# Patient Record
Sex: Female | Born: 1980
Health system: Southern US, Community
[De-identification: ages and names within clinical notes are randomized; demographics above are authoritative.]

## PROBLEM LIST (undated history)

## (undated) DIAGNOSIS — L709 Acne, unspecified: Secondary | ICD-10-CM

## (undated) DIAGNOSIS — O24419 Gestational diabetes mellitus in pregnancy, unspecified control: Secondary | ICD-10-CM

## (undated) DIAGNOSIS — D649 Anemia, unspecified: Secondary | ICD-10-CM

## (undated) DIAGNOSIS — G971 Other reaction to spinal and lumbar puncture: Secondary | ICD-10-CM

## (undated) HISTORY — DX: Acne, unspecified: L70.9

## (undated) HISTORY — PX: TONSILLECTOMY: SUR1361

## (undated) HISTORY — DX: Anemia, unspecified: D64.9

---
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
## 2001-08-14 ENCOUNTER — Other Ambulatory Visit: Admission: RE | Admit: 2001-08-14 | Discharge: 2001-08-14 | Payer: Self-pay | Admitting: *Deleted

## 2002-05-14 ENCOUNTER — Encounter: Payer: Self-pay | Admitting: Emergency Medicine

## 2002-05-14 ENCOUNTER — Emergency Department (HOSPITAL_COMMUNITY): Admission: EM | Admit: 2002-05-14 | Discharge: 2002-05-14 | Payer: Self-pay | Admitting: Emergency Medicine

## 2002-08-31 ENCOUNTER — Other Ambulatory Visit: Admission: RE | Admit: 2002-08-31 | Discharge: 2002-08-31 | Payer: Self-pay | Admitting: *Deleted

## 2003-09-01 ENCOUNTER — Other Ambulatory Visit: Admission: RE | Admit: 2003-09-01 | Discharge: 2003-09-01 | Payer: Self-pay | Admitting: *Deleted

## 2004-09-25 ENCOUNTER — Other Ambulatory Visit: Admission: RE | Admit: 2004-09-25 | Discharge: 2004-09-25 | Payer: Self-pay | Admitting: *Deleted

## 2005-05-01 ENCOUNTER — Emergency Department (HOSPITAL_COMMUNITY): Admission: EM | Admit: 2005-05-01 | Discharge: 2005-05-02 | Payer: Self-pay | Admitting: Emergency Medicine

## 2005-06-14 ENCOUNTER — Ambulatory Visit: Payer: Self-pay | Admitting: Cardiology

## 2005-07-02 ENCOUNTER — Ambulatory Visit: Payer: Self-pay

## 2005-07-02 ENCOUNTER — Encounter: Payer: Self-pay | Admitting: Cardiology

## 2005-12-19 ENCOUNTER — Other Ambulatory Visit: Admission: RE | Admit: 2005-12-19 | Discharge: 2005-12-19 | Payer: Self-pay | Admitting: *Deleted

## 2009-09-28 ENCOUNTER — Ambulatory Visit: Payer: Self-pay | Admitting: Cardiology

## 2009-09-28 ENCOUNTER — Ambulatory Visit: Payer: Self-pay

## 2009-09-28 ENCOUNTER — Ambulatory Visit (HOSPITAL_COMMUNITY): Admission: RE | Admit: 2009-09-28 | Discharge: 2009-09-28 | Payer: Self-pay | Admitting: Family Medicine

## 2009-09-28 ENCOUNTER — Encounter: Payer: Self-pay | Admitting: Family Medicine

## 2009-10-07 ENCOUNTER — Telehealth: Payer: Self-pay | Admitting: Cardiology

## 2010-01-31 NOTE — Progress Notes (Signed)
Summary: test results  Phone Note Call from Patient Call back at Home Phone [phone] Call back at [phone]   Caller: Patient Reason for Call: Lab or Test Results Initial call taken by: Judie Grieve,  October 07, 2009 2:13 PM  Follow-up for Phone Call        I spoke with the pt. Dr. Christell Constant ordered her echo. Results have been faxed to his office. She is aware to contact Dr. Kathi Der office for results. She is agreeable. Follow-up by: Sherri Rad, RN, BSN,  October 07, 2009 2:26 PM

## 2012-03-20 ENCOUNTER — Other Ambulatory Visit: Payer: Self-pay | Admitting: Family Medicine

## 2012-03-20 ENCOUNTER — Ambulatory Visit (INDEPENDENT_AMBULATORY_CARE_PROVIDER_SITE_OTHER): Payer: 59

## 2012-03-20 ENCOUNTER — Ambulatory Visit (INDEPENDENT_AMBULATORY_CARE_PROVIDER_SITE_OTHER): Payer: 59 | Admitting: Family Medicine

## 2012-03-20 ENCOUNTER — Encounter: Payer: Self-pay | Admitting: Family Medicine

## 2012-03-20 VITALS — BP 91/55 | HR 76 | Temp 97.4°F | Ht 61.0 in | Wt 140.0 lb

## 2012-03-20 DIAGNOSIS — R718 Other abnormality of red blood cells: Secondary | ICD-10-CM

## 2012-03-20 DIAGNOSIS — R5381 Other malaise: Secondary | ICD-10-CM

## 2012-03-20 DIAGNOSIS — R079 Chest pain, unspecified: Secondary | ICD-10-CM

## 2012-03-20 DIAGNOSIS — N920 Excessive and frequent menstruation with regular cycle: Secondary | ICD-10-CM | POA: Insufficient documentation

## 2012-03-20 DIAGNOSIS — R0602 Shortness of breath: Secondary | ICD-10-CM

## 2012-03-20 DIAGNOSIS — D649 Anemia, unspecified: Secondary | ICD-10-CM | POA: Insufficient documentation

## 2012-03-20 DIAGNOSIS — R0789 Other chest pain: Secondary | ICD-10-CM

## 2012-03-20 LAB — POCT CBC
Granulocyte percent: 64.8 %G (ref 37–80)
MCH, POC: 26.8 pg — AB (ref 27–31.2)
MPV: 9.4 fL (ref 0–99.8)
Platelet Count, POC: 265 10*3/uL (ref 142–424)
RBC: 4.6 M/uL (ref 4.04–5.48)
WBC: 6.6 10*3/uL (ref 4.6–10.2)

## 2012-03-20 NOTE — Progress Notes (Signed)
Subjective:    Patient ID: Katherine Wilkerson, female    DOB: [birth year], 32 y.o.   MRN: [id number]  HPI 31 YR OLD cf PRESENTS WITH CHEST PRESSure for the past month or so.  Seems to have worsened.  Pressure comes and goes-tends to worsen toward the end of the day and persists into the evening.  Also, she has some DOE out of proportion to the activity that she feels like she is doing. Alleviating factors include stopping activity and during sleep. (5/10 pain). No recent dental visits.  She is +/- compliance on her iron supplementation.  Echo 09/2009-normal EF. No mitral regurg.  Mild tricuspid regurg. (Dr. Antoine Poche performed and I reviewed in chart)  Last gyn visit January 2014 Dr. Renaldo Fiddler at [facility].  She gave her some OCPs(patient doesn't remember name of pills) she never started this birth control pill area   Review of Systems  Constitutional: Negative for chills.  Respiratory: Positive for chest tightness, shortness of breath and stridor. Negative for cough and wheezing.   Cardiovascular: Positive for chest pain (pressure) and palpitations.  Gastrointestinal: Negative for nausea, vomiting, abdominal pain, diarrhea, constipation, blood in stool and abdominal distention.  Genitourinary: Positive for menstrual problem (heavy periods). Negative for dysuria, frequency and flank pain.  Neurological: Positive for dizziness and headaches. Negative for numbness.       Objective:   Physical Exam BP 91/55  Pulse 76  Temp(Src) 97.4 F (36.3 C) (Oral)  Ht 5\' 1"  (1.549 m)  Wt 140 lb (63.504 kg)  BMI 26.47 kg/m2  The patient appeared well nourished and normally developed, alert and oriented to time and place. Speech, behavior and judgement appear normal. Vital signs as documented.  Head exam is unremarkable. No scleral icterus or pallor noted.  Neck is without jugular venous distension, thyromegally, or carotid bruits. Carotid upstrokes are brisk bilaterally. No cervical  adenopathy. Lungs are clear anteriorly and posteriorly to auscultation. Normal respiratory effort. Cardiac exam reveals regular rate and rhythm. First and second heart sounds normal. No audible murmurs, rubs or gallops.  Abdominal exam reveals normal bowl sounds, no masses, no organomegaly and no aortic enlargement. No inguinal adenopathy. Extremities are nonedematous and both femoral and pedal pulses are normal. Skin without pallor or jaundice.  Warm and dry, without rash. Neurologic exam reveals normal deep tendon reflexes and normal sensation.     Results for orders placed in visit on 03/20/12  POCT CBC      Result Value Range   WBC 6.6  4.6 - 10.2 K/uL   Lymph, poc 1.9  0.6 - 3.4   POC LYMPH PERCENT 29.4  10 - 50 %L   MID (cbc)    0 - 0.9   POC MID %    0 - 12 %M   POC Granulocyte 4.3  2 - 6.9   Granulocyte percent 64.8  37 - 80 %G   RBC 4.6  4.04 - 5.48 M/uL   Hemoglobin 12.3  12.2 - 16.2 g/dL   HCT, POC 36 (*) 40.9 - 47.9 %   MCV 78.5 (*) 80 - 97 fL   MCH, POC 26.8 (*) 27 - 31.2 pg   MCHC 34.2  31.8 - 35.4 g/dL   RDW, POC 81.1     Platelet Count, POC 265  142 - 424 K/uL   MPV 9.4  0 - 99.8 fL   [facility] reading (PRIMARY) by  Dr. Christell Constant appeared normal  EKG: :"normal EKG, normal sinus rhythm","unchanged from previous tracings".       Assessment & Plan:  1. SOB (shortness of breath)  - POCT CBC - EKG 12-Lead - TSH  2. RBC microcytosis  - Anemia panel 7  3. Other malaise and fatigue  - TSH - Anemia panel 7 Review of the labs when available Pt to call with the name of the birth control pill which she received from a gynecologist She is not going to start that medicine yet She is supposed to have increase compliance with her current medication taking one daily iron   Spent 40 minutes with patient and her mother and answered all of her questions pertaining to labs being drawn x-rays and EKGs and followup

## 2012-03-21 LAB — ANEMIA PANEL 7
%SAT: 6 % — ABNORMAL LOW (ref 20–55)
ABS Retic: 37.4 10*3/uL (ref 19.0–186.0)
Ferritin: 3 ng/mL — ABNORMAL LOW (ref 10–291)
HCT: 36.1 % (ref 36.0–46.0)
MCH: 25.7 pg — ABNORMAL LOW (ref 26.0–34.0)
MCV: 77.3 fL — ABNORMAL LOW (ref 78.0–100.0)
RDW: 14.4 % (ref 11.5–15.5)
TIBC: 433 ug/dL (ref 250–470)
Vitamin B-12: 446 pg/mL (ref 211–911)
WBC: 6.1 10*3/uL (ref 4.0–10.5)

## 2012-03-27 MED ORDER — TANDEM PLUS 162-115.2-1 MG PO CAPS: 1.0000 | ORAL_CAPSULE | Freq: Every day | ORAL | Status: DC

## 2012-03-27 NOTE — Addendum Note (Signed)
Addended by: Bearl Mulberry on: 03/27/2012 06:29 PM   Modules accepted: Orders

## 2012-04-10 ENCOUNTER — Ambulatory Visit: Payer: 59 | Admitting: Family Medicine

## 2012-04-16 ENCOUNTER — Encounter: Payer: Self-pay | Admitting: Family Medicine

## 2012-04-16 ENCOUNTER — Ambulatory Visit (INDEPENDENT_AMBULATORY_CARE_PROVIDER_SITE_OTHER): Payer: 59 | Admitting: Family Medicine

## 2012-04-16 VITALS — BP 96/58 | HR 76 | Temp 97.1°F | Wt 139.4 lb

## 2012-04-16 DIAGNOSIS — M79609 Pain in unspecified limb: Secondary | ICD-10-CM

## 2012-04-16 DIAGNOSIS — M79604 Pain in right leg: Secondary | ICD-10-CM

## 2012-04-16 NOTE — Patient Instructions (Signed)
Continue birth control by mouth. Take regularly. Start iron 1 daily Get Doppler study

## 2012-04-16 NOTE — Progress Notes (Signed)
  Subjective:    Patient ID: Katherine Wilkerson, female    DOB: 05-19-1980, 32 y.o.   MRN: [id number]  HPI Katherine Wilkerson complains of increased leg pain and slight swelling for the past 3 weeks. She had some of this leg pain prior to starting the BCP.  Review of Systems  Musculoskeletal: Positive for arthralgias (bilateral calf pain with swelling L foot).   no fever coughing shortness of breath or chest pain.     Objective:   Physical Exam  Nursing note and vitals reviewed. Constitutional: She is oriented to person, place, and time. She appears well-developed and well-nourished. No distress.  HENT:  Head: Normocephalic.  Eyes: Conjunctivae are normal.  Neck: Normal range of motion.  Cardiovascular: Normal rate, regular rhythm and normal heart sounds.  Exam reveals no gallop and no friction rub.   No murmur heard. Good pedal pulses bilaterally  Pulmonary/Chest: Effort normal and breath sounds normal. No respiratory distress. She has no wheezes. She has no rales. She exhibits no tenderness.  Abdominal: Soft.  Musculoskeletal: Normal range of motion. She exhibits edema (slight edema in the dorsal left foot. Slight tenderness dorsal left foot) and tenderness.  Homans sign negative bilaterally  Neurological: She is alert and oriented to person, place, and time.  Skin: Skin is warm and dry.  Psychiatric: She has a normal mood and affect. Her behavior is normal. Judgment and thought content normal.          Assessment & Plan:  Bilateral leg and foot pain  Patient Instructions  Continue birth control by mouth. Take regularly. Start iron 1 daily Get Doppler study

## 2012-04-17 ENCOUNTER — Ambulatory Visit
Admission: RE | Admit: 2012-04-17 | Discharge: 2012-04-17 | Disposition: A | Discharge status: Home / Self Care | Payer: 59 | Source: Ambulatory Visit | Attending: Family Medicine | Admitting: Family Medicine

## 2012-04-17 DIAGNOSIS — M79604 Pain in right leg: Secondary | ICD-10-CM

## 2012-04-17 DIAGNOSIS — M79605 Pain in left leg: Secondary | ICD-10-CM

## 2012-05-12 ENCOUNTER — Encounter: Payer: Self-pay | Admitting: Family Medicine

## 2012-05-12 ENCOUNTER — Other Ambulatory Visit: Payer: Self-pay | Admitting: Family Medicine

## 2012-05-12 ENCOUNTER — Ambulatory Visit (INDEPENDENT_AMBULATORY_CARE_PROVIDER_SITE_OTHER): Payer: 59 | Admitting: Family Medicine

## 2012-05-12 VITALS — BP 102/64 | HR 64 | Temp 97.2°F | Ht 61.0 in | Wt 140.2 lb

## 2012-05-12 DIAGNOSIS — N920 Excessive and frequent menstruation with regular cycle: Secondary | ICD-10-CM

## 2012-05-12 DIAGNOSIS — D649 Anemia, unspecified: Secondary | ICD-10-CM

## 2012-05-12 DIAGNOSIS — R5381 Other malaise: Secondary | ICD-10-CM

## 2012-05-12 DIAGNOSIS — R5383 Other fatigue: Secondary | ICD-10-CM

## 2012-05-12 LAB — IRON AND TIBC
%SAT: 6 % — ABNORMAL LOW (ref 20–55)
Iron: 28 ug/dL — ABNORMAL LOW (ref 42–145)
UIBC: 453 ug/dL — ABNORMAL HIGH (ref 125–400)

## 2012-05-12 LAB — POCT CBC
Granulocyte percent: 63.3 %G (ref 37–80)
HCT, POC: 35.5 % — AB (ref 37.7–47.9)
Hemoglobin: 11.7 g/dL — AB (ref 12.2–16.2)
MCHC: 33 g/dL (ref 31.8–35.4)
POC Granulocyte: 4.3 (ref 2–6.9)

## 2012-05-12 NOTE — Progress Notes (Signed)
  Subjective:    Patient ID: Katherine Wilkerson, female    DOB: [birth year], 32 y.o.   MRN: [id number]  HPI Since starting the birth control pill for heavy flow and anemia she has felt some better. She is also still taking the iron by mouth but we called in for her. This is tandem plus.   Review of Systems  Constitutional: Positive for fatigue (improving).  Respiratory: Positive for shortness of breath (improving).   Musculoskeletal: Positive for myalgias (leg pain improving).       Objective:   Physical Exam  Nursing note and vitals reviewed. Constitutional: She is oriented to person, place, and time. She appears well-developed and well-nourished.  HENT:  Head: Normocephalic and atraumatic.  Eyes: Conjunctivae are normal.  Neck: Normal range of motion.  Cardiovascular: Normal rate, regular rhythm and normal heart sounds.   No murmur heard. Pulmonary/Chest: Effort normal and breath sounds normal. No respiratory distress. She has no wheezes.  Abdominal: Soft. Bowel sounds are normal. She exhibits no mass. There is no tenderness. There is no guarding.  Musculoskeletal: Normal range of motion. She exhibits no edema and no tenderness.  Neurological: She is alert and oriented to person, place, and time.  Skin: Skin is warm and dry. Rash noted.  Slight facial rash each cheek near the mouth. These are raised papular areas.  Psychiatric: Her behavior is normal. Judgment and thought content normal.  Some tearfulness and anxiety with a lot of things that are happening in her life personally.          Assessment & Plan:   1. Anemia - POCT CBC - Iron and TIBC  2. Fatigue - POCT CBC - Iron and TIBC  3. Menorrhagia   Patient Instructions  Continue current medications as doing until lab work is back. Hopefully we can discontinue the iron pil,l but not until we get the lab work is back here Remember,caffeine can increase anxiety levels. Continue current birth control pill at  least for a couple more cycles. Then maybe talk with GYN and see if there is a cheaper alternative that will help both the      complexion and iron

## 2012-05-12 NOTE — Patient Instructions (Addendum)
Continue current medications as doing until lab work is back. Hopefully we can discontinue the iron pil,l but not until we get the lab work is back here Remember,caffeine can increase anxiety levels. Continue current birth control pill at least for a couple more cycles. Then maybe talk with GYN and see if there is a cheaper alternative that will help both the      complexion and iron

## 2012-05-13 LAB — FERRITIN: Ferritin: 3 ng/mL — ABNORMAL LOW (ref 10–291)

## 2012-05-14 ENCOUNTER — Telehealth: Payer: Self-pay | Admitting: *Deleted

## 2012-05-14 DIAGNOSIS — D649 Anemia, unspecified: Secondary | ICD-10-CM

## 2012-05-14 NOTE — Telephone Encounter (Signed)
Pt notified of results Order put in for CBC

## 2012-05-14 NOTE — Telephone Encounter (Signed)
Message copied by Bearl Mulberry on Wed May 14, 2012  5:01 PM ------      Message from: Ernestina Penna      Created: Mon May 12, 2012  5:28 PM       Hemoglobin 11.7 slightly decreased from a month, WBC within normal limits, platelet count normal      Continue current iron medication and birth control as doing, recheck CBC with differential in 6 weeks      Ferritin level will be back within the next 2 days, will call when this is available ------

## 2012-05-22 ENCOUNTER — Telehealth: Payer: Self-pay | Admitting: *Deleted

## 2012-05-22 NOTE — Telephone Encounter (Signed)
Left message for pt to call Per Tammy pt mat want to try liquid iron sulfate elixir for better absorption 10ml mixed with orange juice

## 2012-05-28 ENCOUNTER — Telehealth: Payer: Self-pay | Admitting: Family Medicine

## 2012-05-29 ENCOUNTER — Telehealth: Payer: Self-pay | Admitting: *Deleted

## 2012-05-29 NOTE — Telephone Encounter (Signed)
Discussed with pt the option of taking the liquid iron supplement instead of the tablet form due to better absorption Pt wants to wait until labs are rechecked before changing

## 2012-06-27 ENCOUNTER — Other Ambulatory Visit (INDEPENDENT_AMBULATORY_CARE_PROVIDER_SITE_OTHER): Payer: 59

## 2012-06-27 DIAGNOSIS — D649 Anemia, unspecified: Secondary | ICD-10-CM

## 2012-06-27 LAB — POCT CBC
Granulocyte percent: 66.1 %G (ref 37–80)
Hemoglobin: 12.1 g/dL — AB (ref 12.2–16.2)
MCHC: 34.9 g/dL (ref 31.8–35.4)
MPV: 9 fL (ref 0–99.8)
POC Granulocyte: 4.5 (ref 2–6.9)
RBC: 4.3 M/uL (ref 4.04–5.48)

## 2012-06-27 NOTE — Progress Notes (Signed)
Pt came in for labs only 

## 2012-07-03 ENCOUNTER — Telehealth: Payer: Self-pay | Admitting: Family Medicine

## 2012-07-03 NOTE — Telephone Encounter (Signed)
Pt notified of results Will return in 2 weeks for repeat CBC

## 2012-07-03 NOTE — Telephone Encounter (Signed)
Message copied by Bearl Mulberry on Thu Jul 03, 2012  5:36 PM ------      Message from: Ernestina Penna      Created: Fri Jun 27, 2012  6:11 PM       WBC is within normal, the hemoglobin is slightly improved from what it was one month ago. He is now 12.1 the normal is 12.2-16.2. Ask if she is still taking the iron that we called in for her. If so continue to take this and recheck CBC in 2 months ------

## 2012-07-11 ENCOUNTER — Other Ambulatory Visit: Payer: Self-pay | Admitting: Family Medicine

## 2012-07-11 NOTE — Telephone Encounter (Signed)
Last seen 05/12/12  [facility]    Last CBC 06/27/12  Hemoglobin 12.1   Do you want to Refill for patient?

## 2012-08-27 ENCOUNTER — Ambulatory Visit: Payer: 59 | Admitting: Family Medicine

## 2012-08-27 ENCOUNTER — Telehealth: Payer: Self-pay | Admitting: Family Medicine

## 2012-08-27 NOTE — Telephone Encounter (Signed)
appt rescheduled.

## 2012-09-10 ENCOUNTER — Other Ambulatory Visit: Payer: Self-pay | Admitting: Family Medicine

## 2012-09-29 ENCOUNTER — Ambulatory Visit (INDEPENDENT_AMBULATORY_CARE_PROVIDER_SITE_OTHER): Payer: 59 | Admitting: Family Medicine

## 2012-09-29 ENCOUNTER — Encounter: Payer: Self-pay | Admitting: Family Medicine

## 2012-09-29 VITALS — BP 102/61 | HR 63 | Temp 98.3°F | Ht 61.0 in | Wt 143.0 lb

## 2012-09-29 DIAGNOSIS — D649 Anemia, unspecified: Secondary | ICD-10-CM

## 2012-09-29 DIAGNOSIS — N920 Excessive and frequent menstruation with regular cycle: Secondary | ICD-10-CM

## 2012-09-29 NOTE — Progress Notes (Signed)
Subjective:    Patient ID: Katherine Wilkerson, female    DOB: 1980-03-13, 32 y.o.   MRN: [id number]  HPI Pt here for follow up and management of chronic medical problems. Patient has been taking her iron medication and a birth control by mouth which has iron in it. Patient says she is feeling much better and that she has been taking the iron pill about 5 days weekly but does take a birth control pill regularly. She does have a visit our plans a visit to have her pelvic exam done in December. She indicates that the birth control pill because of his brand-name is very expensive and she will call the gynecologist to try to get a generic version of this pill.   Patient Active Problem List   Diagnosis Date Noted  . Menorrhagia 03/20/2012  . Anemia 03/20/2012   Outpatient Encounter Prescriptions as of 09/29/2012  Medication Sig Dispense Refill  . clindamycin-tretinoin (ZIANA) gel Apply topically at bedtime.      . FeFum-FePo-FA-B Cmp-C-Zn-Mn-Cu (TANDEM PLUS) 162-115.2-1 MG CAPS TAKE (1) CAPSULE DAILY  30 each  3  . Norethin-Eth Estradiol-Fe (GENERESS FE PO) Take 1 tablet by mouth daily.      . [DISCONTINUED] cholecalciferol (VITAMIN D) 1000 UNITS tablet Take 4,000 Units by mouth daily.       . [DISCONTINUED] clindamycin-tretinoin (ZIANA) gel Apply topically at bedtime.       No facility-administered encounter medications on file as of 09/29/2012.       Review of Systems  Constitutional: Negative.   HENT: Negative.   Eyes: Negative.   Respiratory: Negative.   Cardiovascular: Negative.   Gastrointestinal: Negative.   Endocrine: Negative.   Genitourinary: Negative.        Heavy periods- better now with bcp  Musculoskeletal: Negative.   Skin: Negative.   Allergic/Immunologic: Negative.   Neurological: Negative.   Hematological: Negative.  Does not bruise/bleed easily.  Psychiatric/Behavioral: Negative.        Objective:   Physical Exam  Nursing note and vitals  reviewed. Constitutional: She is oriented to person, place, and time. She appears well-developed and well-nourished. No distress.  HENT:  Head: Normocephalic and atraumatic.  Right Ear: External ear normal.  Left Ear: External ear normal.  Mouth/Throat: Oropharynx is clear and moist. No oropharyngeal exudate.  Eyes: Conjunctivae are normal. Right eye exhibits no discharge. Left eye exhibits no discharge. No scleral icterus.  Neck: Normal range of motion. Neck supple. No thyromegaly present.  Cardiovascular: Normal rate, regular rhythm and normal heart sounds.   No murmur heard. Pulmonary/Chest: Effort normal and breath sounds normal. She has no wheezes. She has no rales.  Abdominal: Soft. Bowel sounds are normal. There is no rebound and no guarding.  Musculoskeletal: Normal range of motion. She exhibits no edema.  Lymphadenopathy:    She has no cervical adenopathy.  Neurological: She is alert and oriented to person, place, and time. She has normal reflexes.  Skin: Skin is warm and dry. No rash noted.  Psychiatric: She has a normal mood and affect. Her behavior is normal. Judgment and thought content normal.   BP 102/61  Pulse 63  Temp(Src) 98.3 F (36.8 C) (Oral)  Ht 5\' 1"  (1.549 m)  Wt 143 lb (64.864 kg)  BMI 27.03 kg/m2  LMP 09/12/2012         Assessment & Plan:   1. Menorrhagia   2. Anemia     Orders Placed This Encounter  Procedures  . Anemia Profile B  Meds ordered this encounter  Medications  . clindamycin-tretinoin (ZIANA) gel    Sig: Apply topically at bedtime.   Patient Instructions  Continue current medications. Continue good therapeutic lifestyle changes.  Fall precautions discussed with patient. Schedule your flu vaccine the first of October. Follow up as planned and earlier as needed.  Continue to take tandem Monday through Friday and continue birth control pill as doing other than trying to get the generic version.  CBC in about 6 weeks Annual  physical exam in December    Nyra Capes MD

## 2012-09-29 NOTE — Patient Instructions (Addendum)
Continue current medications. Continue good therapeutic lifestyle changes.  Fall precautions discussed with patient. Schedule your flu vaccine the first of October. Follow up as planned and earlier as needed.  Continue to take tandem Monday through Friday and continue birth control pill as doing other than trying to get the generic version.  CBC in about 6 weeks Annual physical exam in December

## 2012-09-30 LAB — ANEMIA PROFILE B
Basophils Absolute: 0 10*3/uL (ref 0.0–0.2)
HCT: 37.9 % (ref 34.0–46.6)
Iron Saturation: 11 % — ABNORMAL LOW (ref 15–55)
Iron: 44 ug/dL (ref 35–155)
Lymphocytes Absolute: 1.8 10*3/uL (ref 0.7–3.1)
MCH: 28.3 pg (ref 26.6–33.0)
MCV: 85 fL (ref 79–97)
Monocytes: 9 %
Neutrophils Absolute: 3.9 10*3/uL (ref 1.4–7.0)
RDW: 12.9 % (ref 12.3–15.4)
TIBC: 399 ug/dL (ref 250–450)
UIBC: 355 ug/dL (ref 150–375)
WBC: 6.3 10*3/uL (ref 3.4–10.8)

## 2012-11-14 ENCOUNTER — Encounter (INDEPENDENT_AMBULATORY_CARE_PROVIDER_SITE_OTHER): Payer: Self-pay

## 2012-11-14 ENCOUNTER — Other Ambulatory Visit (INDEPENDENT_AMBULATORY_CARE_PROVIDER_SITE_OTHER): Payer: 59

## 2012-11-14 DIAGNOSIS — N920 Excessive and frequent menstruation with regular cycle: Secondary | ICD-10-CM

## 2012-11-14 DIAGNOSIS — D649 Anemia, unspecified: Secondary | ICD-10-CM

## 2012-11-14 LAB — POCT CBC
Granulocyte percent: 64.8 %G (ref 37–80)
HCT, POC: 38 % (ref 37.7–47.9)
Lymph, poc: 2 (ref 0.6–3.4)
POC Granulocyte: 4.3 (ref 2–6.9)
Platelet Count, POC: 216 10*3/uL (ref 142–424)
RDW, POC: 12.6 %
WBC: 6.6 10*3/uL (ref 4.6–10.2)

## 2012-11-18 ENCOUNTER — Encounter: Payer: Self-pay | Admitting: *Deleted

## 2012-11-18 NOTE — Progress Notes (Signed)
Quick Note:  Copy of labs sent to patient ______ 

## 2012-12-15 ENCOUNTER — Telehealth: Payer: Self-pay | Admitting: *Deleted

## 2012-12-15 MED ORDER — AZITHROMYCIN 250 MG PO TABS: ORAL_TABLET | ORAL | Status: DC

## 2012-12-15 NOTE — Telephone Encounter (Signed)
Please call prescription in for a Z-Pak, take as directed

## 2012-12-15 NOTE — Telephone Encounter (Signed)
Pt is experiencing sinus pressure, congestion, and cough (worse at night)  She has a CPE appt with [facility] tommorow, but thinks this cant wait another day. She would like med called into [facility] pharmacy please.

## 2012-12-16 ENCOUNTER — Encounter: Payer: Self-pay | Admitting: Family Medicine

## 2012-12-16 ENCOUNTER — Ambulatory Visit (INDEPENDENT_AMBULATORY_CARE_PROVIDER_SITE_OTHER): Payer: 59 | Admitting: Family Medicine

## 2012-12-16 VITALS — BP 111/68 | HR 74 | Temp 98.1°F | Ht 61.0 in | Wt 138.0 lb

## 2012-12-16 DIAGNOSIS — Z8719 Personal history of other diseases of the digestive system: Secondary | ICD-10-CM | POA: Insufficient documentation

## 2012-12-16 DIAGNOSIS — J329 Chronic sinusitis, unspecified: Secondary | ICD-10-CM

## 2012-12-16 DIAGNOSIS — D649 Anemia, unspecified: Secondary | ICD-10-CM

## 2012-12-16 DIAGNOSIS — Z0000 Encounter for general adult medical examination without abnormal findings: Secondary | ICD-10-CM

## 2012-12-16 NOTE — Patient Instructions (Addendum)
Continue current medications. Continue good therapeutic lifestyle changes which include good diet and exercise. Schedule your flu vaccine if you haven't had it yet. A cool mist humidifier in her bedroom at night would be helpful for her sinus congestion Saline nose spray, also be helpful if this is used frequently during the day and you can buy this over-the-counter. If problems continue with congestionyou can buy Nasacort over-the-counter. For the chest wall discomfort he may try using some warm wet compresses and leg are some of the weight lifting for a few days. If the discomfort is severe You can try a few Advil or Aleve always taking it after eating. finish the antibiotic that was started yesterday and if you do not continue to improve let us know about this

## 2012-12-16 NOTE — Progress Notes (Signed)
Subjective:    Patient ID: Katherine Wilkerson, female    DOB: 1980/02/08, 32 y.o.   MRN: [id number]  HPI Patient here today for annual physical.patient called yesterday complaining of increased sinus congestion and drainage with some cough requesting an antibiotic and a Z-Pak was called in for this. Shehas stopped taking her iron medication continues to take her  birth control pillswhich have recently been changed by her gynecologist due to the expense of the previous pill.she also complains of some chest wall soreness and attributes this to her exercise workout with a therapist. The sinus problem had been going on for about a week and she had green drainage but no fever. As indicated above this treatment was started yesterday. Her purulence have been heavier with a new birth control pill and she is scheduled to get a pelvic exam today by Dr. Zelphia Cairo.she has had an eye exam this year and indicates that she wears a contact in one. She indicates that her energy level is pretty good.     Patient Active Problem List   Diagnosis Date Noted  . Menorrhagia 03/20/2012  . Anemia 03/20/2012   Outpatient Encounter Prescriptions as of 12/16/2012  Medication Sig  . azithromycin (ZITHROMAX) 250 MG tablet As directed  . clindamycin-tretinoin (ZIANA) gel Apply topically at bedtime.  . FeFum-FePo-FA-B Cmp-C-Zn-Mn-Cu (TANDEM PLUS) 162-115.2-1 MG CAPS TAKE (1) CAPSULE DAILY  . JUNEL FE 1/20 1-20 MG-MCG tablet Take 1 tablet by mouth daily.  . [DISCONTINUED] Norethin-Eth Estradiol-Fe (GENERESS FE PO) Take 1 tablet by mouth daily.    Review of Systems  Constitutional: Negative.  Negative for fever.  HENT: Positive for congestion, postnasal drip, sinus pressure and sore throat.   Eyes: Negative.   Respiratory: Positive for cough.   Cardiovascular: Negative.   Gastrointestinal: Negative.   Endocrine: Negative.   Genitourinary: Negative.   Musculoskeletal: Negative.   Skin: Negative.     Allergic/Immunologic: Negative.   Neurological: Negative.   Hematological: Negative.   Psychiatric/Behavioral: Negative.        Objective:   Physical Exam  Nursing note and vitals reviewed. Constitutional: She is oriented to person, place, and time. She appears well-developed and well-nourished. No distress.  HENT:  Head: Normocephalic and atraumatic.  Right Ear: External ear normal.  Left Ear: External ear normal.  Mouth/Throat: Oropharynx is clear and moist.  There is nasal congestion bilaterally  Eyes: Conjunctivae and EOM are normal. Pupils are equal, round, and reactive to light. Right eye exhibits no discharge. Left eye exhibits no discharge. No scleral icterus.  Neck: Normal range of motion. Neck supple. No thyromegaly present.  Cardiovascular: Normal rate, regular rhythm, normal heart sounds and intact distal pulses.  Exam reveals no gallop and no friction rub.   No murmur heard. At 72 per minute  Pulmonary/Chest: Effort normal and breath sounds normal. No respiratory distress. She has no wheezes. She has no rales. She exhibits no tenderness.  No axillary nodes  Abdominal: Soft. Bowel sounds are normal. She exhibits no mass. There is tenderness (there is some generalized tendernessbut no specific location). There is no rebound and no guarding.  There are no inguinal nodes  Musculoskeletal: Normal range of motion. She exhibits no edema and no tenderness.  Lymphadenopathy:    She has no cervical adenopathy.  Neurological: She is alert and oriented to person, place, and time. She has normal reflexes. No cranial nerve deficit.  Skin: Skin is warm and dry.  Psychiatric: She has a normal mood and  affect. Her behavior is normal. Judgment and thought content normal.   BP 111/68  Pulse 74  Temp(Src) 98.1 F (36.7 C) (Oral)  Ht 5\' 1"  (1.549 m)  Wt 138 lb (62.596 kg)  BMI 26.09 kg/m2  LMP 12/05/2012        Assessment & Plan:  1. Anemia - Anemia Profile B  2. Annual  physical exam - NMR, lipoprofile - BMP8+EGFR - Hepatic function panel - Thyroid Panel With TSH - Vit D  25 hydroxy (rtn osteoporosis monitoring) - Anemia Profile B  3. Sinusitis  4. History of irritable bowel syndrome  Patient Instructions  Continue current medications. Continue good therapeutic lifestyle changes which include good diet and exercise. Schedule your flu vaccine if you haven't had it yet. A cool mist humidifier in her bedroom at night would be helpful for her sinus congestion Saline nose spray, also be helpful if this is used frequently during the day and you can buy this over-the-counter. If problems continue with congestionyou can buy Nasacort over-the-counter. For the chest wall discomfort he may try using some warm wet compresses and leg are some of the weight lifting for a few days. If the discomfort is severe You can try a few Advil or Aleve always taking it after eating. finish the antibiotic that was started yesterday and if you do not continue to improve let us know about this      Nyra Capes MD

## 2012-12-18 LAB — VITAMIN D 25 HYDROXY (VIT D DEFICIENCY, FRACTURES): Vit D, 25-Hydroxy: 20.8 ng/mL — ABNORMAL LOW (ref 30.0–100.0)

## 2012-12-18 LAB — ANEMIA PROFILE B
Basophils Absolute: 0 10*3/uL (ref 0.0–0.2)
Eos: 1 %
Eosinophils Absolute: 0.1 10*3/uL (ref 0.0–0.4)
Folate: 7.6 ng/mL (ref >3.0)
HCT: 37.2 % (ref 34.0–46.6)
Hemoglobin: 12.3 g/dL (ref 11.1–15.9)
Immature Grans (Abs): 0 10*3/uL (ref 0.0–0.1)
Iron Saturation: 12 % — ABNORMAL LOW (ref 15–55)
Iron: 42 ug/dL (ref 35–155)
Lymphocytes Absolute: 1.5 10*3/uL (ref 0.7–3.1)
MCHC: 33.1 g/dL (ref 31.5–35.7)
MCV: 86 fL (ref 79–97)
Neutrophils Absolute: 7.2 10*3/uL — ABNORMAL HIGH (ref 1.4–7.0)
RBC: 4.32 x10E6/uL (ref 3.77–5.28)
RDW: 12.8 % (ref 12.3–15.4)
TIBC: 356 ug/dL (ref 250–450)
UIBC: 314 ug/dL (ref 150–375)

## 2012-12-18 LAB — NMR, LIPOPROFILE
Cholesterol: 173 mg/dL (ref <200)
HDL Cholesterol by NMR: 42 mg/dL (ref >=40)
HDL Particle Number: 36.8 umol/L (ref >=30.5)
LDLC SERPL CALC-MCNC: 101 mg/dL — ABNORMAL HIGH (ref <100)
LP-IR Score: 56 — ABNORMAL HIGH (ref <=45)
Small LDL Particle Number: 570 nmol/L — ABNORMAL HIGH (ref <=527)
Triglycerides by NMR: 150 mg/dL — ABNORMAL HIGH (ref <150)

## 2012-12-18 LAB — HEPATIC FUNCTION PANEL
Alkaline Phosphatase: 50 IU/L (ref 39–117)
Bilirubin, Direct: 0.1 mg/dL (ref 0.00–0.40)
Total Bilirubin: 0.4 mg/dL (ref 0.0–1.2)
Total Protein: 6.5 g/dL (ref 6.0–8.5)

## 2012-12-18 LAB — BMP8+EGFR
BUN/Creatinine Ratio: 13 (ref 8–20)
Calcium: 9.3 mg/dL (ref 8.7–10.2)
GFR calc Af Amer: 135 mL/min/{1.73_m2} (ref >59)
GFR calc non Af Amer: 117 mL/min/{1.73_m2} (ref >59)
Glucose: 81 mg/dL (ref 65–99)
Potassium: 4.7 mmol/L (ref 3.5–5.2)

## 2012-12-18 LAB — THYROID PANEL WITH TSH: TSH: 1.47 u[IU]/mL (ref 0.450–4.500)

## 2012-12-29 ENCOUNTER — Telehealth: Payer: Self-pay | Admitting: Family Medicine

## 2012-12-29 NOTE — Telephone Encounter (Signed)
Pt aware of labs  

## 2013-01-07 ENCOUNTER — Ambulatory Visit: Payer: 59 | Admitting: Family Medicine

## 2013-06-10 ENCOUNTER — Telehealth: Payer: Self-pay | Admitting: Family Medicine

## 2013-06-10 NOTE — Telephone Encounter (Signed)
appt moved to tomorrow

## 2013-06-11 ENCOUNTER — Ambulatory Visit (INDEPENDENT_AMBULATORY_CARE_PROVIDER_SITE_OTHER): Payer: 59

## 2013-06-11 ENCOUNTER — Ambulatory Visit (INDEPENDENT_AMBULATORY_CARE_PROVIDER_SITE_OTHER): Payer: 59 | Admitting: Family Medicine

## 2013-06-11 ENCOUNTER — Encounter: Payer: Self-pay | Admitting: Family Medicine

## 2013-06-11 VITALS — BP 111/70 | HR 67 | Temp 97.6°F | Ht 61.0 in | Wt 125.0 lb

## 2013-06-11 DIAGNOSIS — R079 Chest pain, unspecified: Secondary | ICD-10-CM

## 2013-06-11 DIAGNOSIS — M940 Chondrocostal junction syndrome [Tietze]: Secondary | ICD-10-CM

## 2013-06-11 DIAGNOSIS — R0789 Other chest pain: Secondary | ICD-10-CM

## 2013-06-11 LAB — POCT CBC
Granulocyte percent: 60.4 %G (ref 37–80)
HCT, POC: 39 % (ref 37.7–47.9)
Hemoglobin: 12.5 g/dL (ref 12.2–16.2)
LYMPH, POC: 1.9 (ref 0.6–3.4)
MCH: 27.4 pg (ref 27–31.2)
MCHC: 31.9 g/dL (ref 31.8–35.4)
MCV: 85.9 fL (ref 80–97)
MPV: 9.2 fL (ref 0–99.8)
PLATELET COUNT, POC: 244 10*3/uL (ref 142–424)
POC Granulocyte: 3.8 (ref 2–6.9)
POC LYMPH %: 30.5 % (ref 10–50)
RBC: 4.5 M/uL (ref 4.04–5.48)
RDW, POC: 12.7 %
WBC: 6.3 10*3/uL (ref 4.6–10.2)

## 2013-06-11 NOTE — Patient Instructions (Signed)
Avoid exercises especially pushups chest wall stretching heavy lifting for the next 7-10 day Use warm wet compresses 20 minutes 3 or 4 times daily to the chest wall Take ibuprofen 200 mg one after each meal for the next 7-10 days To protect your stomach from the ibuprofen take Zantac 75 twice daily before breakfast and supper

## 2013-06-11 NOTE — Progress Notes (Signed)
Subjective:    Patient ID: Katherine Wilkerson, female    DOB: Jul 16, 1980, 33 y.o.   MRN: [id number]  HPI Patient comes in today with left sided chest pain/presure,shortness of breath and fatigue. She noticed the onset after pulling a muscle in her chest but the problem has been more persistent over the past week. The patient has been exercising a lot and may have irritated her chest wall with her exercising. Her echocardiogram that was done in 2011 was reviewed with her today and she had a normal ejection fraction and mild if any mitral valve prolapse    Review of Systems  Constitutional: Positive for fatigue.  HENT: Negative.   Eyes: Negative.   Respiratory: Positive for chest tightness and shortness of breath.   Cardiovascular: Positive for chest pain.  Gastrointestinal: Negative.   Endocrine: Negative.   Genitourinary: Negative.   Musculoskeletal: Negative.   Skin: Negative.   Allergic/Immunologic: Negative.   Neurological: Negative.   Hematological: Negative.   Psychiatric/Behavioral: Positive for sleep disturbance.       Objective:   Physical Exam  Nursing note and vitals reviewed. Constitutional: She is oriented to person, place, and time. She appears well-developed and well-nourished. No distress.  HENT:  Head: Normocephalic and atraumatic.  Right Ear: External ear normal.  Left Ear: External ear normal.  Nose: Nose normal.  Mouth/Throat: Oropharynx is clear and moist.  Eyes: Conjunctivae and EOM are normal. Pupils are equal, round, and reactive to light. Right eye exhibits no discharge. Left eye exhibits no discharge. No scleral icterus.  Neck: Normal range of motion. Neck supple. No thyromegaly present.  Cardiovascular: Normal rate, regular rhythm, normal heart sounds and intact distal pulses.   No murmur heard. 72 per minute  Pulmonary/Chest: Effort normal and breath sounds normal. No respiratory distress. She has no wheezes. She has no rales. She exhibits  tenderness.  Pressure applied to the left sternal border with deep breathing was reproducible of the pain that she was having  Abdominal: Soft. Bowel sounds are normal. She exhibits no mass. There is tenderness (slight epigastric tenderness). There is no rebound and no guarding.  Musculoskeletal: Normal range of motion. She exhibits tenderness (costochondral tenderness left greater than right). She exhibits no edema.  Lymphadenopathy:    She has no cervical adenopathy.  Neurological: She is alert and oriented to person, place, and time.  Skin: Skin is warm and dry. No rash noted.  Psychiatric: She has a normal mood and affect. Her behavior is normal. Judgment and thought content normal.    BP 111/70  Pulse 67  Temp(Src) 97.6 F (36.4 C) (Oral)  Ht 5\' 1"  (1.549 m)  Wt 125 lb (56.7 kg)  BMI 23.63 kg/m2  [facility] reading (PRIMARY) by  Dr.Analiz Tvedt-chest x-ray-no active disease  Results for orders placed in visit on 06/11/13  POCT CBC      Result Value Ref Range   WBC 6.3  4.6 - 10.2 K/uL   Lymph, poc 1.9  0.6 - 3.4   POC LYMPH PERCENT 30.5  10 - 50 %L   POC Granulocyte 3.8  2 - 6.9   Granulocyte percent 60.4  37 - 80 %G   RBC 4.5  4.04 - 5.48 M/uL   Hemoglobin 12.5  12.2 - 16.2 g/dL   HCT, POC 40.939.0  81.137.7 - 47.9 %   MCV 85.9  80 - 97 fL   MCH, POC 27.4  27 - 31.2 pg   MCHC 31.9  31.8 - 35.4  g/dL   RDW, POC 36.6     Platelet Count, POC 244.0  142 - 424 K/uL   MPV 9.2  0 - 99.8 fL   The patient was made aware of both of these reports before she left the office                                  EKG: Within normal   The results of the EKG were discussed with the patient before she left the office    Assessment & Plan:  1. Chest pain - POCT CBC - DG Chest 2 View; Future - EKG 12-Lead  2. Costochondritis -See. treatment plan  3. Chest tightness -Secondary to costochondral inflammation  Patient Instructions  Avoid exercises especially pushups chest wall stretching heavy lifting  for the next 7-10 day Use warm wet compresses 20 minutes 3 or 4 times daily to the chest wall Take ibuprofen 200 mg one after each meal for the next 7-10 days To protect your stomach from the ibuprofen take Zantac 75 twice daily before breakfast and supper    Nyra Capes MD

## 2013-06-12 ENCOUNTER — Telehealth: Payer: Self-pay

## 2013-06-12 NOTE — Telephone Encounter (Signed)
Pt aware of CXR results.

## 2013-06-12 NOTE — Telephone Encounter (Signed)
Message copied by Roselee CulverHUMLEY, Nataliyah Packham on Fri Jun 12, 2013 12:45 PM ------      Message from: Ernestina PennaMOORE, DONALD W      Created: Thu Jun 11, 2013  6:47 PM       As per radiology report ------

## 2013-06-16 ENCOUNTER — Ambulatory Visit: Payer: 59 | Admitting: Family Medicine

## 2013-09-01 ENCOUNTER — Encounter: Payer: Self-pay | Admitting: Family Medicine

## 2013-09-01 ENCOUNTER — Ambulatory Visit (INDEPENDENT_AMBULATORY_CARE_PROVIDER_SITE_OTHER): Payer: 59 | Admitting: Family Medicine

## 2013-09-01 VITALS — BP 120/69 | HR 86 | Temp 98.5°F | Ht 61.0 in | Wt 123.0 lb

## 2013-09-01 DIAGNOSIS — R05 Cough: Secondary | ICD-10-CM

## 2013-09-01 DIAGNOSIS — J301 Allergic rhinitis due to pollen: Secondary | ICD-10-CM

## 2013-09-01 DIAGNOSIS — R059 Cough, unspecified: Secondary | ICD-10-CM

## 2013-09-01 DIAGNOSIS — J029 Acute pharyngitis, unspecified: Secondary | ICD-10-CM

## 2013-09-01 DIAGNOSIS — J069 Acute upper respiratory infection, unspecified: Secondary | ICD-10-CM

## 2013-09-01 LAB — POCT RAPID STREP A (OFFICE): RAPID STREP A SCREEN: NEGATIVE

## 2013-09-01 MED ORDER — AZELASTINE HCL 0.1 % NA SOLN: NASAL | Status: DC

## 2013-09-01 MED ORDER — AZITHROMYCIN 250 MG PO TABS: ORAL_TABLET | ORAL | Status: DC

## 2013-09-01 NOTE — Progress Notes (Signed)
Subjective:    Patient ID: Katherine Wilkerson, female    DOB: March 01, 1980, 33 y.o.   MRN: [id number]  HPI Patient here today for cold and congestion that started about 3-4 days ago. The patient has had some chills and fever.         Patient Active Problem List   Diagnosis Date Noted  . History of irritable bowel syndrome 12/16/2012  . Menorrhagia 03/20/2012  . iron deficiency anemia 03/20/2012   Outpatient Encounter Prescriptions as of 09/01/2013  Medication Sig  . JUNEL FE 1/20 1-20 MG-MCG tablet Take 1 tablet by mouth daily.  . Multiple Vitamin (MULTIVITAMIN WITH MINERALS) TABS tablet Take 1 tablet by mouth daily.  . Probiotic Product (PROBIOTIC DAILY PO) Take by mouth.  . [DISCONTINUED] cholecalciferol (VITAMIN D) 1000 UNITS tablet Take 1,000 Units by mouth daily.  . [DISCONTINUED] ferrous sulfate 325 (65 FE) MG tablet Take 325 mg by mouth daily with breakfast.  . [DISCONTINUED] magnesium 30 MG tablet Take 30 mg by mouth 2 (two) times daily.    Review of Systems  Constitutional: Positive for chills. Negative for fever.  HENT: Positive for congestion, ear pain (pressure), sinus pressure and sore throat.   Eyes: Negative.   Respiratory: Positive for cough.   Cardiovascular: Negative.   Gastrointestinal: Negative.   Endocrine: Negative.   Genitourinary: Negative.   Musculoskeletal: Negative.   Skin: Negative.   Allergic/Immunologic: Negative.   Neurological: Positive for headaches.  Hematological: Negative.   Psychiatric/Behavioral: Negative.        Objective:   Physical Exam  Nursing note and vitals reviewed. Constitutional: She is oriented to person, place, and time. She appears well-developed and well-nourished. No distress.  HENT:  Head: Normocephalic and atraumatic.  Right Ear: External ear normal.  Left Ear: External ear normal.  There is nasal congestion bilaterally and the throat was red posteriorly  Eyes: Conjunctivae and EOM are normal. Pupils are  equal, round, and reactive to light. Right eye exhibits no discharge. Left eye exhibits no discharge. No scleral icterus.  Neck: Normal range of motion. Neck supple. No JVD present. No thyromegaly present.  Cardiovascular: Normal rate, regular rhythm, normal heart sounds and intact distal pulses.  Exam reveals no gallop and no friction rub.   No murmur heard. Pulmonary/Chest: Effort normal and breath sounds normal. No respiratory distress. She has no wheezes. She has no rales. She exhibits no tenderness.  Dry cough  Abdominal: Soft. Bowel sounds are normal.  Musculoskeletal: Normal range of motion.  Lymphadenopathy:    She has cervical adenopathy.  Neurological: She is alert and oriented to person, place, and time.  Skin: Skin is warm and dry. No rash noted.  Psychiatric: She has a normal mood and affect. Her behavior is normal. Judgment and thought content normal.   BP 120/69  Pulse 86  Temp(Src) 98.5 F (36.9 C) (Oral)  Ht  (1.549 m)  Wt 123 lb (55.792 kg)  BMI 23.25 kg/m2  Results for orders placed in visit on 09/01/13  POCT RAPID STREP A (OFFICE)      Result Value Ref Range   Rapid Strep A Screen Negative  Negative         Assessment & Plan:  1. Sore throat - POCT rapid strep A - Strep A culture, throat - azithromycin (ZITHROMAX) 250 MG tablet; 2 pills the first day then one daily  Dispense: 6 tablet; Refill: 0  2. URI (upper respiratory infection) - azithromycin (ZITHROMAX) 250 MG tablet; 2  pills the first day then one daily  Dispense: 6 tablet; Refill: 0  3. Allergic rhinitis due to pollen - azelastine (ASTELIN) 0.1 % nasal spray; Use in each nostril as directed 1-2 sprays at bedtime  Dispense: 30 mL; Refill: 12 - azithromycin (ZITHROMAX) 250 MG tablet; 2 pills the first day then one daily  Dispense: 6 tablet; Refill: 0  4. Cough - azithromycin (ZITHROMAX) 250 MG tablet; 2 pills the first day then one daily  Dispense: 6 tablet; Refill: 0 Patient Instructions     Use nose sprays and take antibiotic as directed Gargle with warm salty water Use saline irrigation of the nose during the day Drink plenty of fluids Take Tylenol as needed for aches pains and fever   Nyra Capes MD

## 2013-09-01 NOTE — Patient Instructions (Signed)
Use nose sprays and take antibiotic as directed Gargle with warm salty water Use saline irrigation of the nose during the day Drink plenty of fluids Take Tylenol as needed for aches pains and fever

## 2013-09-03 LAB — STREP A CULTURE, THROAT: STREP A CULTURE: NEGATIVE

## 2013-11-09 ENCOUNTER — Ambulatory Visit (INDEPENDENT_AMBULATORY_CARE_PROVIDER_SITE_OTHER): Payer: 59 | Admitting: Family Medicine

## 2013-11-09 ENCOUNTER — Encounter: Payer: Self-pay | Admitting: Family Medicine

## 2013-11-09 VITALS — BP 86/52 | HR 62 | Temp 97.7°F | Ht 61.0 in | Wt 122.2 lb

## 2013-11-09 DIAGNOSIS — R5383 Other fatigue: Secondary | ICD-10-CM

## 2013-11-09 DIAGNOSIS — R42 Dizziness and giddiness: Secondary | ICD-10-CM

## 2013-11-09 LAB — POCT URINALYSIS DIPSTICK
Bilirubin, UA: NEGATIVE
Blood, UA: NEGATIVE
Glucose, UA: NEGATIVE
Ketones, UA: NEGATIVE
Leukocytes, UA: NEGATIVE
Nitrite, UA: NEGATIVE
Protein, UA: NEGATIVE
Spec Grav, UA: 1.01
Urobilinogen, UA: NEGATIVE
pH, UA: 6.5

## 2013-11-09 LAB — POCT UA - MICROSCOPIC ONLY
Bacteria, U Microscopic: NEGATIVE
Casts, Ur, LPF, POC: NEGATIVE
Crystals, Ur, HPF, POC: NEGATIVE
Mucus, UA: NEGATIVE
RBC, urine, microscopic: NEGATIVE
WBC, Ur, HPF, POC: NEGATIVE
Yeast, UA: NEGATIVE

## 2013-11-09 LAB — POCT CBC
Granulocyte percent: 61.6 %G (ref 37–80)
HCT, POC: 40.3 % (ref 37.7–47.9)
Hemoglobin: 13.1 g/dL (ref 12.2–16.2)
Lymph, poc: 2.5 (ref 0.6–3.4)
MCH, POC: 28.2 pg (ref 27–31.2)
MCHC: 32.6 g/dL (ref 31.8–35.4)
MCV: 86.6 fL (ref 80–97)
MPV: 9.5 fL (ref 0–99.8)
POC Granulocyte: 4.7 (ref 2–6.9)
POC LYMPH PERCENT: 32.4 %L (ref 10–50)
Platelet Count, POC: 231 10*3/uL (ref 142–424)
RBC: 4.7 M/uL (ref 4.04–5.48)
RDW, POC: 13.3 %
WBC: 7.6 10*3/uL (ref 4.6–10.2)

## 2013-11-09 NOTE — Progress Notes (Signed)
Subjective:    Patient ID: Katherine Wilkerson, female    DOB: [birth year], 33 y.o.   MRN: [id number]  HPI Patient is here with c/o dizziness and fatigue.  She has hx of anemia. She has been having some sinus drainage.  Review of Systems  Constitutional: Negative for fever.  HENT: Negative for ear pain.   Eyes: Negative for discharge.  Respiratory: Negative for cough.   Cardiovascular: Negative for chest pain.  Gastrointestinal: Negative for abdominal distention.  Endocrine: Negative for polyuria.  Genitourinary: Negative for difficulty urinating.  Musculoskeletal: Negative for gait problem and neck pain.  Skin: Negative for color change and rash.  Neurological: Negative for speech difficulty and headaches.  Psychiatric/Behavioral: Negative for agitation.       Objective:    BP 86/52 mmHg  Pulse 62  Temp(Src) 97.7 F (36.5 C) (Oral)  Ht 5\' 1"  (1.549 m)  Wt 122 lb 3.2 oz (55.43 kg)  BMI 23.10 kg/m2  LMP 11/03/2013   Physical Exam  Constitutional: She is oriented to person, place, and time. She appears well-developed and well-nourished.  HENT:  Head: Normocephalic and atraumatic.  Mouth/Throat: Oropharynx is clear and moist.  Eyes: Pupils are equal, round, and reactive to light.  Neck: Normal range of motion. Neck supple.  Cardiovascular: Normal rate and regular rhythm.   No murmur heard. Pulmonary/Chest: Effort normal and breath sounds normal.  Abdominal: Soft. Bowel sounds are normal. There is no tenderness.  Neurological: She is alert and oriented to person, place, and time.  Skin: Skin is warm and dry.  Psychiatric: She has a normal mood and affect.     Results for orders placed or performed in visit on 11/09/13  POCT CBC  Result Value Ref Range   WBC 7.6 4.6 - 10.2 K/uL   Lymph, poc 2.5 0.6 - 3.4   POC LYMPH PERCENT 32.4 10 - 50 %L   POC Granulocyte 4.7 2 - 6.9   Granulocyte percent 61.6 37 - 80 %G   RBC 4.7 4.04 - 5.48 M/uL   Hemoglobin 13.1 12.2 -  16.2 g/dL   HCT, POC 78.240.3 95.637.7 - 47.9 %   MCV 86.6 80 - 97 fL   MCH, POC 28.2 27 - 31.2 pg   MCHC 32.6 31.8 - 35.4 g/dL   RDW, POC 21.313.3 %   Platelet Count, POC 231.0 142 - 424 K/uL   MPV 9.5 0 - 99.8 fL  POCT UA - Microscopic Only  Result Value Ref Range   WBC, Ur, HPF, POC neg    RBC, urine, microscopic neg    Bacteria, U Microscopic neg    Mucus, UA neg    Epithelial cells, urine per micros rare    Crystals, Ur, HPF, POC neg    Casts, Ur, LPF, POC neg    Yeast, UA neg   POCT urinalysis dipstick  Result Value Ref Range   Color, UA yellow    Clarity, UA clear    Glucose, UA neg    Bilirubin, UA neg    Ketones, UA neg    Spec Grav, UA 1.010    Blood, UA neg    pH, UA 6.5    Protein, UA neg    Urobilinogen, UA negative    Nitrite, UA neg    Leukocytes, UA Negative        Assessment & Plan:     ICD-9-CM ICD-10-CM   1. Dizziness and giddiness 780.4 R42 POCT CBC     POCT  UA - Microscopic Only     POCT urinalysis dipstick  2. Other fatigue 780.79 R53.83 POCT CBC     POCT UA - Microscopic Only     POCT urinalysis dipstick   Push po fluids, rest, tylenol and motrin otc prn as directed for fever, arthralgias, and myalgias.  Follow up prn if sx's continue or persist.  No Follow-up on file.  Deatra CanterWilliam J Oxford FNP

## 2013-11-17 ENCOUNTER — Ambulatory Visit: Payer: 59 | Admitting: Family Medicine

## 2013-12-17 ENCOUNTER — Encounter: Payer: Self-pay | Admitting: Family Medicine

## 2013-12-17 ENCOUNTER — Encounter (INDEPENDENT_AMBULATORY_CARE_PROVIDER_SITE_OTHER): Payer: Self-pay

## 2013-12-17 ENCOUNTER — Ambulatory Visit (INDEPENDENT_AMBULATORY_CARE_PROVIDER_SITE_OTHER): Payer: 59 | Admitting: Family Medicine

## 2013-12-17 VITALS — BP 117/78 | HR 72 | Temp 97.0°F | Ht 61.0 in | Wt 116.0 lb

## 2013-12-17 DIAGNOSIS — D649 Anemia, unspecified: Secondary | ICD-10-CM

## 2013-12-17 DIAGNOSIS — Z0000 Encounter for general adult medical examination without abnormal findings: Secondary | ICD-10-CM

## 2013-12-17 DIAGNOSIS — F4322 Adjustment disorder with anxiety: Secondary | ICD-10-CM

## 2013-12-17 MED ORDER — HYDROXYZINE HCL 10 MG PO TABS: 10.0000 mg | ORAL_TABLET | Freq: Three times a day (TID) | ORAL | Status: DC | PRN

## 2013-12-17 NOTE — Progress Notes (Signed)
Subjective:    Patient ID: Katherine Wilkerson, female    DOB: 1980/09/17, 33 y.o.   MRN: [id number]  HPI Patient is here today for annual wellness exam and follow up of chronic medical problems. The patient is doing well overall. No specific complaints. She just got married and is adjusting to married life and working at the same time. These are normal things that she expects to be adjusting today.      Patient Active Problem List   Diagnosis Date Noted  . History of irritable bowel syndrome 12/16/2012  . Menorrhagia 03/20/2012  . iron deficiency anemia 03/20/2012   Outpatient Encounter Prescriptions as of 12/17/2013  Medication Sig  . Multiple Vitamin (MULTIVITAMIN WITH MINERALS) TABS tablet Take 1 tablet by mouth daily.  . Probiotic Product (PROBIOTIC DAILY PO) Take by mouth.  . [DISCONTINUED] Multiple Vitamin (MULTIVITAMIN WITH MINERALS) TABS tablet Take 1 tablet by mouth daily.  . [DISCONTINUED] azelastine (ASTELIN) 0.1 % nasal spray Use in each nostril as directed 1-2 sprays at bedtime  . [DISCONTINUED] JUNEL FE 1/20 1-20 MG-MCG tablet Take 1 tablet by mouth daily.    Review of Systems  Constitutional: Negative.   HENT: Negative.   Eyes: Negative.   Respiratory: Negative.   Cardiovascular: Negative.   Gastrointestinal: Negative.   Endocrine: Negative.   Genitourinary: Negative.   Musculoskeletal: Negative.   Skin: Negative.   Allergic/Immunologic: Negative.   Neurological: Negative.   Hematological: Negative.   Psychiatric/Behavioral: Negative.        Objective:   Physical Exam  Constitutional: She is oriented to person, place, and time. She appears well-developed and well-nourished. No distress.  HENT:  Head: Normocephalic and atraumatic.  Right Ear: External ear normal.  Left Ear: External ear normal.  Nose: Nose normal.  Mouth/Throat: Oropharynx is clear and moist.  Eyes: Conjunctivae and EOM are normal. Pupils are equal, round, and reactive to light.  Right eye exhibits no discharge. Left eye exhibits no discharge. No scleral icterus.  Neck: Normal range of motion. Neck supple. No JVD present. No thyromegaly present.  Cardiovascular: Normal rate, regular rhythm, normal heart sounds and intact distal pulses.  Exam reveals no gallop and no friction rub.   No murmur heard. At 60/m  Pulmonary/Chest: Effort normal and breath sounds normal. No respiratory distress. She has no wheezes. She has no rales. She exhibits no tenderness.  Abdominal: Soft. Bowel sounds are normal. She exhibits no mass. There is no tenderness. There is no rebound and no guarding.  Musculoskeletal: Normal range of motion. She exhibits no edema or tenderness.  Lymphadenopathy:    She has no cervical adenopathy.  Neurological: She is alert and oriented to person, place, and time. She has normal reflexes. No cranial nerve deficit.  Skin: Skin is warm and dry. No rash noted.  Psychiatric: She has a normal mood and affect. Her behavior is normal. Judgment and thought content normal.  Nursing note and vitals reviewed.  BP 117/78 mmHg  Pulse 72  Temp(Src) 97 F (36.1 C) (Oral)  Ht 5' 1"  (1.549 m)  Wt 116 lb (52.617 kg)  BMI 21.93 kg/m2  LMP 11/09/2013        Assessment & Plan:  1. Annual physical exam - BMP8+EGFR; Future - Hepatic function panel; Future - NMR, lipoprofile; Future - Anemia Profile B; Future - Vit D  25 hydroxy (rtn osteoporosis monitoring); Future - Thyroid Panel With TSH; Future  2. Anemia, unspecified anemia type - Anemia Profile B; Future  3.  Adjustment disorder with anxious mood - hydrOXYzine (ATARAX/VISTARIL) 10 MG tablet; Take 1 tablet (10 mg total) by mouth 3 (three) times daily as needed.  Dispense: 30 tablet; Refill: 0  Patient Instructions  Continue to follow up with your gynecologist and let her adjust your birth control medication as she thinks it is appropriate Drink plenty of fluids Exercise regularly Eat healthy Return to  clinic for fasting lab work   Arrie Senate MD

## 2013-12-17 NOTE — Patient Instructions (Addendum)
Continue to follow up with your gynecologist and let her adjust your birth control medication as she thinks it is appropriate Drink plenty of fluids Exercise regularly Eat healthy Return to clinic for fasting lab work

## 2013-12-28 ENCOUNTER — Other Ambulatory Visit (INDEPENDENT_AMBULATORY_CARE_PROVIDER_SITE_OTHER): Payer: 59

## 2013-12-28 DIAGNOSIS — Z0000 Encounter for general adult medical examination without abnormal findings: Secondary | ICD-10-CM

## 2013-12-28 DIAGNOSIS — D649 Anemia, unspecified: Secondary | ICD-10-CM

## 2013-12-28 NOTE — Progress Notes (Signed)
LAB ONLY 

## 2013-12-29 ENCOUNTER — Telehealth: Payer: Self-pay

## 2013-12-29 LAB — ANEMIA PROFILE B
Basophils Absolute: 0 10*3/uL (ref 0.0–0.2)
Basos: 1 %
EOS ABS: 0.1 10*3/uL (ref 0.0–0.4)
EOS: 1 %
FERRITIN: 46 ng/mL (ref 15–150)
Folate: 9.7 ng/mL (ref >3.0)
HCT: 38.8 % (ref 34.0–46.6)
Hemoglobin: 13.1 g/dL (ref 11.1–15.9)
IMMATURE GRANULOCYTES: 0 %
IRON SATURATION: 23 % (ref 15–55)
Immature Grans (Abs): 0 10*3/uL (ref 0.0–0.1)
Iron: 76 ug/dL (ref 35–155)
LYMPHS ABS: 1.3 10*3/uL (ref 0.7–3.1)
Lymphs: 24 %
MCH: 29.4 pg (ref 26.6–33.0)
MCHC: 33.8 g/dL (ref 31.5–35.7)
MCV: 87 fL (ref 79–97)
MONOS ABS: 0.6 10*3/uL (ref 0.1–0.9)
Monocytes: 11 %
Neutrophils Absolute: 3.4 10*3/uL (ref 1.4–7.0)
Neutrophils Relative %: 63 %
PLATELETS: 261 10*3/uL (ref 150–379)
RBC: 4.45 x10E6/uL (ref 3.77–5.28)
RDW: 12.6 % (ref 12.3–15.4)
Retic Ct Pct: 0.6 % (ref 0.6–2.6)
TIBC: 324 ug/dL (ref 250–450)
UIBC: 248 ug/dL (ref 150–375)
VITAMIN B 12: 407 pg/mL (ref 211–946)
WBC: 5.3 10*3/uL (ref 3.4–10.8)

## 2013-12-29 LAB — NMR, LIPOPROFILE
Cholesterol: 153 mg/dL (ref 100–199)
HDL Cholesterol by NMR: 49 mg/dL (ref >39)
HDL Particle Number: 33.6 umol/L (ref >=30.5)
LDL PARTICLE NUMBER: 1106 nmol/L — AB (ref <1000)
LDL SIZE: 21 nm (ref >20.5)
LDL-C: 91 mg/dL (ref 0–99)
LP-IR Score: 32 (ref <=45)
Small LDL Particle Number: 391 nmol/L (ref <=527)
Triglycerides by NMR: 63 mg/dL (ref 0–149)

## 2013-12-29 LAB — BMP8+EGFR
BUN / CREAT RATIO: 16 (ref 8–20)
BUN: 10 mg/dL (ref 6–20)
CO2: 24 mmol/L (ref 18–29)
Calcium: 9.4 mg/dL (ref 8.7–10.2)
Chloride: 106 mmol/L (ref 97–108)
Creatinine, Ser: 0.64 mg/dL (ref 0.57–1.00)
GFR calc Af Amer: 137 mL/min/{1.73_m2} (ref >59)
GFR calc non Af Amer: 118 mL/min/{1.73_m2} (ref >59)
Glucose: 79 mg/dL (ref 65–99)
Potassium: 4.7 mmol/L (ref 3.5–5.2)
SODIUM: 142 mmol/L (ref 134–144)

## 2013-12-29 LAB — THYROID PANEL WITH TSH
FREE THYROXINE INDEX: 2.3 (ref 1.2–4.9)
T3 UPTAKE RATIO: 28 % (ref 24–39)
T4 TOTAL: 8.3 ug/dL (ref 4.5–12.0)
TSH: 2.28 u[IU]/mL (ref 0.450–4.500)

## 2013-12-29 LAB — HEPATIC FUNCTION PANEL
ALBUMIN: 4.4 g/dL (ref 3.5–5.5)
ALK PHOS: 44 IU/L (ref 39–117)
ALT: 16 IU/L (ref 0–32)
AST: 15 IU/L (ref 0–40)
Bilirubin, Direct: 0.23 mg/dL (ref 0.00–0.40)
TOTAL PROTEIN: 6.3 g/dL (ref 6.0–8.5)
Total Bilirubin: 0.9 mg/dL (ref 0.0–1.2)

## 2013-12-29 LAB — VITAMIN D 25 HYDROXY (VIT D DEFICIENCY, FRACTURES): VIT D 25 HYDROXY: 46.1 ng/mL (ref 30.0–100.0)

## 2013-12-29 NOTE — Telephone Encounter (Signed)
Letter sent with results since pt requested a copy

## 2013-12-29 NOTE — Telephone Encounter (Signed)
-----   Message from Ernestina Pennaonald W Moore, MD sent at 12/29/2013  7:31 AM EST ----- Please give a copy of this lab work to the patient  The blood sugar is good at 79. The kidney function test and electrolytes including potassium are within normal limits All liver function tests are within normal limits Cholesterol numbers with advanced lipid testing have a total LDL particle number that is slightly elevated at 1106. One year ago it was much higher. The LDL C is good. The triglycerides are good. The good cholesterol or the HDL particle number is also good. Continue with aggressive therapeutic lifestyle changes which include diet and exercise The serum iron and iron saturation and ferritin levels are all good and within normal limits and improved from 1 year ago. The B12 level is also within normal limits. The serum folate is within normal limits. The hemoglobin is stable and improved from 1 month ago at 13.1. The white blood cell count is not elevated indicating there is no infection. The platelet count is adequate. The vitamin D level is within normal limits and improved from 1 year ago, continue current treatment All thyroid function tests are within normal limits.

## 2014-01-06 ENCOUNTER — Ambulatory Visit (INDEPENDENT_AMBULATORY_CARE_PROVIDER_SITE_OTHER): Payer: 59 | Admitting: Licensed Clinical Social Worker

## 2014-01-06 DIAGNOSIS — F419 Anxiety disorder, unspecified: Secondary | ICD-10-CM

## 2014-11-09 LAB — OB RESULTS CONSOLE ABO/RH: RH TYPE: POSITIVE

## 2014-11-09 LAB — OB RESULTS CONSOLE RPR: RPR: NONREACTIVE

## 2014-11-09 LAB — OB RESULTS CONSOLE HIV ANTIBODY (ROUTINE TESTING): HIV: NONREACTIVE

## 2014-11-09 LAB — OB RESULTS CONSOLE HEPATITIS B SURFACE ANTIGEN: HEP B S AG: NEGATIVE

## 2014-11-09 LAB — OB RESULTS CONSOLE RUBELLA ANTIBODY, IGM: Rubella: IMMUNE

## 2014-11-26 IMAGING — US US EXTREM LOW VENOUS BILAT
1 series · 14 of 24 positions shown · non-contrast
Comparison: None

CLINICAL DATA: Bil calf pain.  Pt is on oral contraceptive pills.;;

BILATERAL LOWER EXTREMITY VENOUS DUPLEX ULTRASOUND
TECHNIQUE: Gray-scale sonography with compression, as well as color
and duplex ultrasound, were performed to evaluate the deep venous
system from the level of the common femoral vein through the
popliteal and proximal calf veins.

[Series 1: us extrem low venous bilat · 14 of 75 slices shown]
[im 1/75]
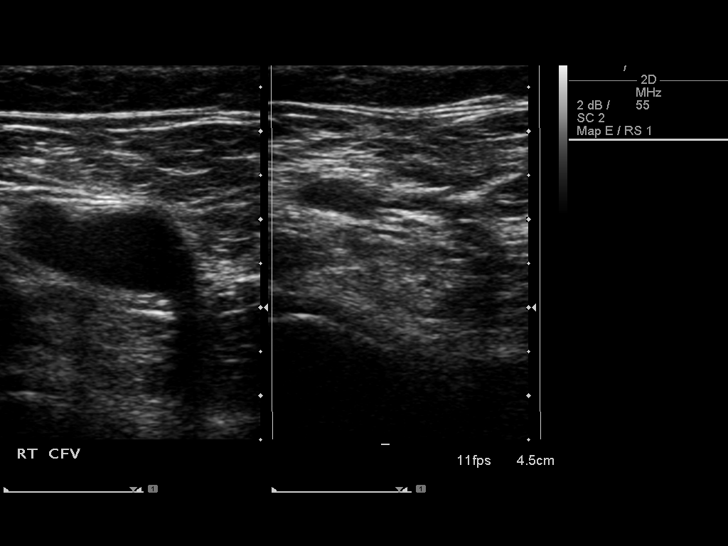
[im 7/75]
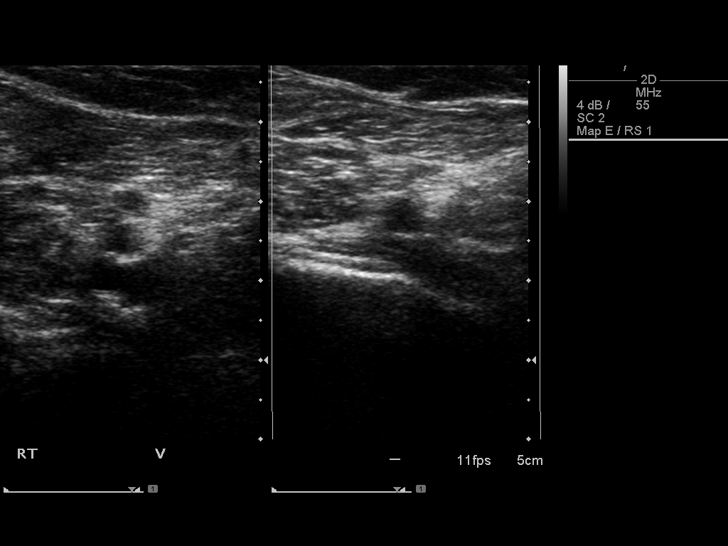
[im 13/75]
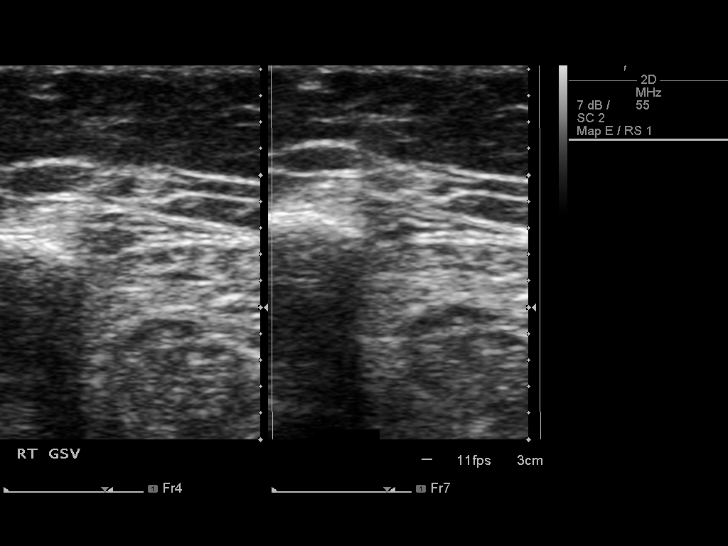
[im 20/75]
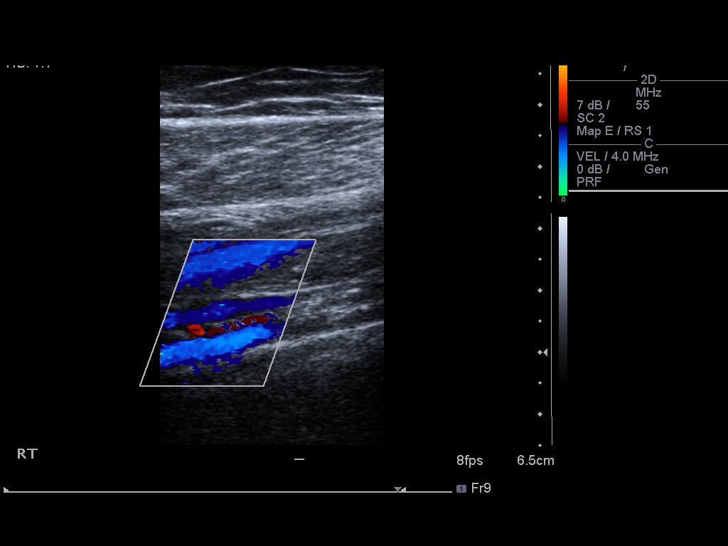
[im 23/75]
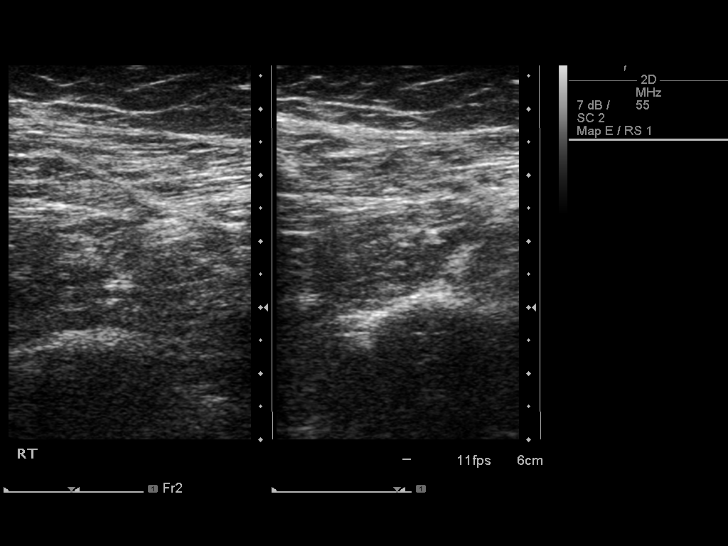
[im 29/75]
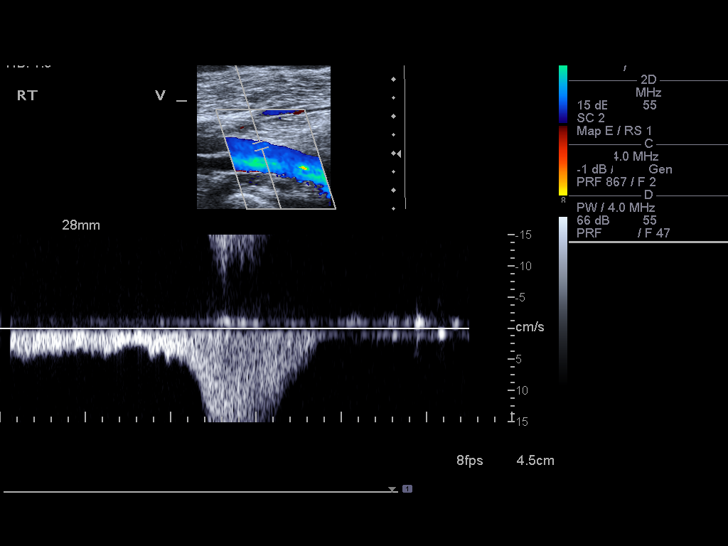
[im 36/75]
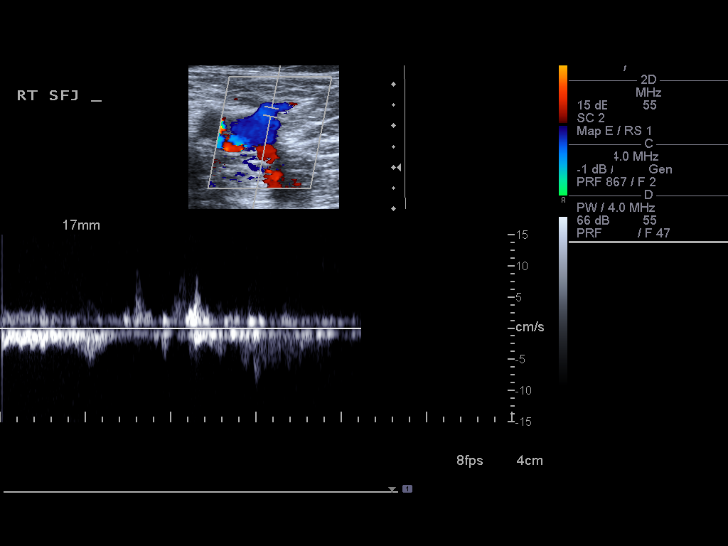
[im 39/75]
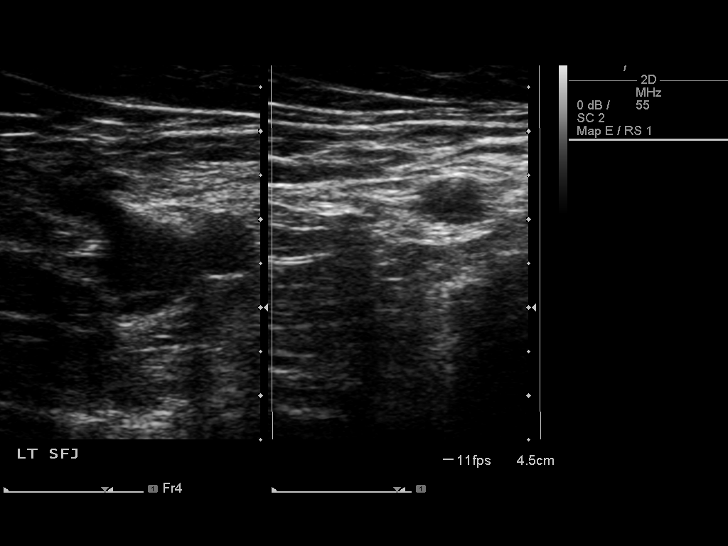
[im 46/75]
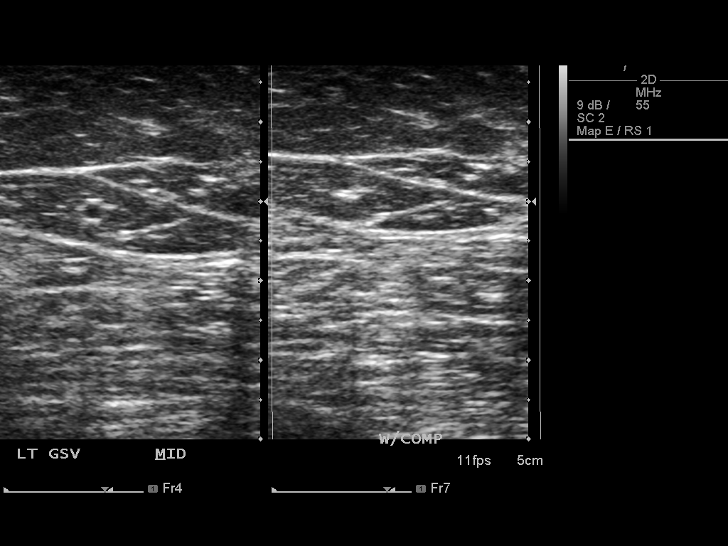
[im 52/75]
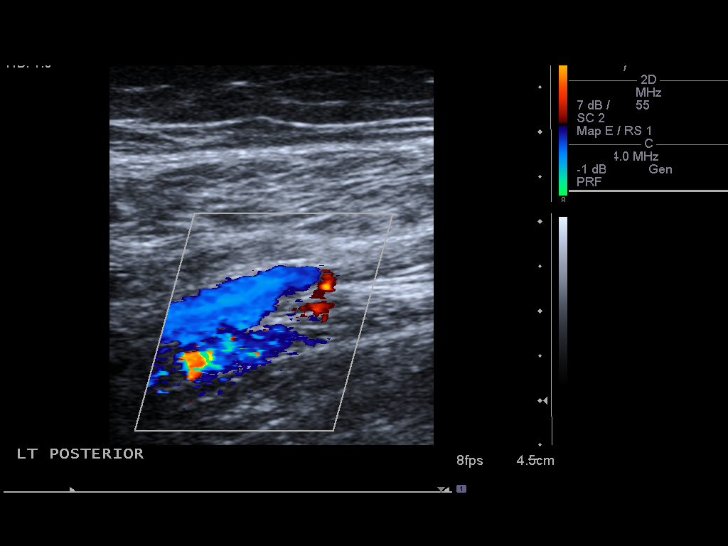
[im 58/75]
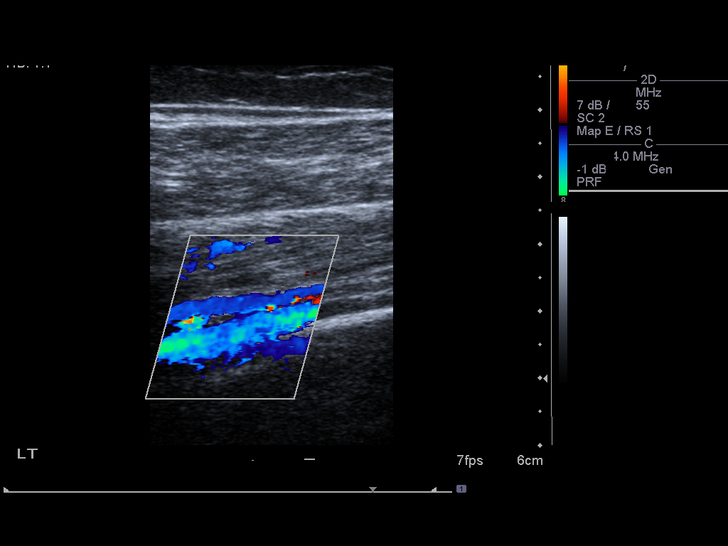
[im 62/75]
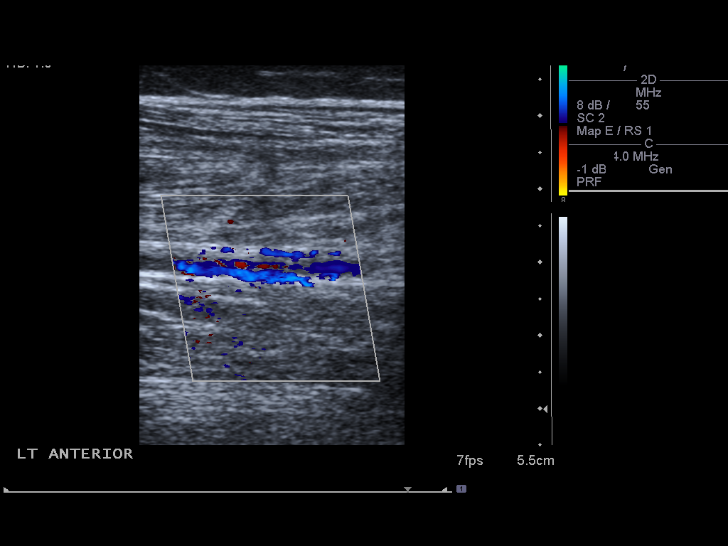
[im 68/75]
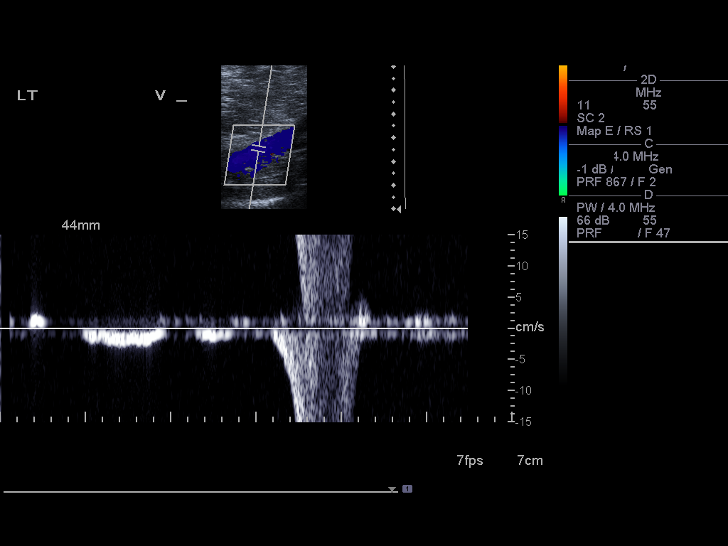
[im 75/75]
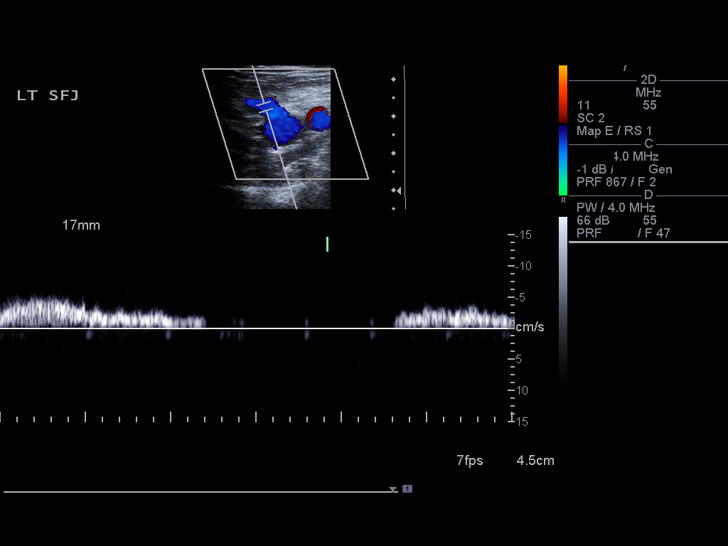

[14 of 24 positions shown; findings below may reference images not displayed]

FINDINGS: Normal compressibility and normal Doppler signal within
the common femoral, superficial femoral and popliteal veins, down
to the proximal calf veins.  No grayscale filling defects to
suggest DVT.
IMPRESSION: No evidence of bilateral lower extremity deep vein thrombosis.

## 2014-12-22 ENCOUNTER — Ambulatory Visit (INDEPENDENT_AMBULATORY_CARE_PROVIDER_SITE_OTHER): Payer: 59 | Admitting: Family Medicine

## 2014-12-22 ENCOUNTER — Encounter: Payer: Self-pay | Admitting: Family Medicine

## 2014-12-22 VITALS — BP 108/66 | HR 78 | Temp 97.4°F | Ht 61.0 in | Wt 121.0 lb

## 2014-12-22 DIAGNOSIS — Z3491 Encounter for supervision of normal pregnancy, unspecified, first trimester: Secondary | ICD-10-CM

## 2014-12-22 DIAGNOSIS — R002 Palpitations: Secondary | ICD-10-CM | POA: Diagnosis not present

## 2014-12-22 DIAGNOSIS — Z0000 Encounter for general adult medical examination without abnormal findings: Secondary | ICD-10-CM | POA: Diagnosis not present

## 2014-12-22 NOTE — Progress Notes (Signed)
Subjective:    Patient ID: Katherine Wilkerson, female    DOB: May 12, 1980, 34 y.o.   MRN: [id number]  HPI Patient is here today for annual wellness exam and follow up of chronic medical problems which includes anemia. The patient does complain of her heart racing at times. Her last menstrual. Was 9 of 2016 and she is now almost 3 months pregnant. She's been followed by her GYN. She has a visit tomorrow. She will discussed getting a flu shot from her which she does need. We will not do any lab work because of her being followed by the [facility] and for her pregnancy. Her vital signs are stable. She is due in June and other than nausea is doing well with her pregnancy.   Patient Active Problem List   Diagnosis Date Noted  . History of irritable bowel syndrome 12/16/2012  . Menorrhagia 03/20/2012  . iron deficiency anemia 03/20/2012   Outpatient Encounter Prescriptions as of 12/22/2014  Medication Sig  . hydrOXYzine (ATARAX/VISTARIL) 10 MG tablet Take 1 tablet (10 mg total) by mouth 3 (three) times daily as needed.  . Multiple Vitamin (MULTIVITAMIN WITH MINERALS) TABS tablet Take 1 tablet by mouth daily.  . Probiotic Product (PROBIOTIC DAILY PO) Take by mouth.   No facility-administered encounter medications on file as of 12/22/2014.      Review of Systems  Constitutional: Negative.   HENT: Negative.   Eyes: Negative.   Respiratory: Negative.   Cardiovascular: Negative.        Feels like heart beats faster than normal   Endocrine: Negative.   Genitourinary: Negative.   Musculoskeletal: Negative.   Skin: Negative.   Allergic/Immunologic: Negative.   Neurological: Negative.   Hematological: Negative.   Psychiatric/Behavioral: Negative.        Objective:   Physical Exam  Constitutional: She is oriented to person, place, and time. She appears well-developed and well-nourished. No distress.  HENT:  Head: Normocephalic and atraumatic.  Right Ear: External ear normal.    Left Ear: External ear normal.  Nose: Nose normal.  Mouth/Throat: Oropharynx is clear and moist. No oropharyngeal exudate.  Eyes: Conjunctivae and EOM are normal. Pupils are equal, round, and reactive to light. Right eye exhibits no discharge. Left eye exhibits no discharge. No scleral icterus.  Neck: Normal range of motion. Neck supple. No thyromegaly present.  Cardiovascular: Normal rate, regular rhythm and normal heart sounds.   No murmur heard. 72/m with a regular rate and rhythm  Pulmonary/Chest: Effort normal and breath sounds normal. No respiratory distress. She has no wheezes. She has no rales. She exhibits no tenderness.  Abdominal: Soft. Bowel sounds are normal. She exhibits no mass. There is no tenderness. There is no rebound and no guarding.  Musculoskeletal: Normal range of motion. She exhibits no edema or tenderness.  Lymphadenopathy:    She has no cervical adenopathy.  Neurological: She is alert and oriented to person, place, and time. She has normal reflexes. No cranial nerve deficit.  Skin: Skin is warm and dry. No rash noted.  Psychiatric: She has a normal mood and affect. Her behavior is normal. Judgment and thought content normal.  Nursing note and vitals reviewed.  BP 108/66 mmHg  Pulse 78  Temp(Src) 97.4 F (36.3 C) (Oral)  Ht  (1.549 m)  Wt 121 lb (54.885 kg)  BMI 22.87 kg/m2  LMP 09/02/2014  EKG: Within normal limits      Assessment & Plan:  1. Annual physical exam -The patient  is doing well and we will do no blood work at this visit today because of her pregnancy and getting blood work with her obstetrician.  2. Intrauterine normal pregnancy, first trimester -Follow-up with OB  3. Palpitations -EKG  Patient Instructions  The patient should continue to take her multivitamins and continue to follow-up for her pregnancy care with her obstetrician   Nyra Capeson W. Moore MD

## 2014-12-22 NOTE — Patient Instructions (Signed)
The patient should continue to take her multivitamins and continue to follow-up for her pregnancy care with her obstetrician

## 2015-06-08 LAB — OB RESULTS CONSOLE GBS: STREP GROUP B AG: POSITIVE

## 2015-06-12 ENCOUNTER — Inpatient Hospital Stay (HOSPITAL_COMMUNITY): Payer: 59 | Admitting: Anesthesiology

## 2015-06-12 ENCOUNTER — Encounter (HOSPITAL_COMMUNITY): Payer: Self-pay | Admitting: Certified Nurse Midwife

## 2015-06-12 ENCOUNTER — Inpatient Hospital Stay (HOSPITAL_COMMUNITY): Payer: 59

## 2015-06-12 ENCOUNTER — Inpatient Hospital Stay (HOSPITAL_COMMUNITY)
Admission: AD | Admit: 2015-06-12 | Discharge: 2015-06-16 | DRG: 766 | Disposition: A | Discharge status: Home / Self Care | Payer: 59 | Source: Ambulatory Visit | Attending: Obstetrics & Gynecology | Admitting: Obstetrics & Gynecology

## 2015-06-12 DIAGNOSIS — D509 Iron deficiency anemia, unspecified: Secondary | ICD-10-CM | POA: Diagnosis present

## 2015-06-12 DIAGNOSIS — G971 Other reaction to spinal and lumbar puncture: Secondary | ICD-10-CM | POA: Diagnosis not present

## 2015-06-12 DIAGNOSIS — O368130 Decreased fetal movements, third trimester, not applicable or unspecified: Secondary | ICD-10-CM | POA: Diagnosis present

## 2015-06-12 DIAGNOSIS — O324XX0 Maternal care for high head at term, not applicable or unspecified: Secondary | ICD-10-CM | POA: Diagnosis present

## 2015-06-12 DIAGNOSIS — O99824 Streptococcus B carrier state complicating childbirth: Secondary | ICD-10-CM | POA: Diagnosis present

## 2015-06-12 DIAGNOSIS — O9902 Anemia complicating childbirth: Secondary | ICD-10-CM | POA: Diagnosis present

## 2015-06-12 DIAGNOSIS — Z98891 History of uterine scar from previous surgery: Secondary | ICD-10-CM

## 2015-06-12 DIAGNOSIS — Z3A39 39 weeks gestation of pregnancy: Secondary | ICD-10-CM

## 2015-06-12 DIAGNOSIS — O368190 Decreased fetal movements, unspecified trimester, not applicable or unspecified: Secondary | ICD-10-CM

## 2015-06-12 DIAGNOSIS — O368390 Maternal care for abnormalities of the fetal heart rate or rhythm, unspecified trimester, not applicable or unspecified: Secondary | ICD-10-CM | POA: Diagnosis present

## 2015-06-12 LAB — URINALYSIS, ROUTINE W REFLEX MICROSCOPIC
Bilirubin Urine: NEGATIVE
GLUCOSE, UA: NEGATIVE mg/dL
KETONES UR: NEGATIVE mg/dL
NITRITE: NEGATIVE
PROTEIN: NEGATIVE mg/dL
Specific Gravity, Urine: <1.005 — ABNORMAL LOW (ref 1.005–1.030)
pH: 6.5 (ref 5.0–8.0)

## 2015-06-12 LAB — CBC
HCT: 30.7 % — ABNORMAL LOW (ref 36.0–46.0)
Hemoglobin: 9.8 g/dL — ABNORMAL LOW (ref 12.0–15.0)
MCH: 26.9 pg (ref 26.0–34.0)
MCHC: 31.9 g/dL (ref 30.0–36.0)
MCV: 84.3 fL (ref 78.0–100.0)
PLATELETS: 209 10*3/uL (ref 150–400)
RBC: 3.64 MIL/uL — AB (ref 3.87–5.11)
RDW: 14.9 % (ref 11.5–15.5)
WBC: 7.1 10*3/uL (ref 4.0–10.5)

## 2015-06-12 LAB — URINE MICROSCOPIC-ADD ON

## 2015-06-12 LAB — TYPE AND SCREEN
ABO/RH(D): O POS
Antibody Screen: NEGATIVE

## 2015-06-12 MED ORDER — BUTORPHANOL TARTRATE 1 MG/ML IJ SOLN: 1.0000 mg | INTRAMUSCULAR | Status: DC | PRN

## 2015-06-12 MED ORDER — EPHEDRINE 5 MG/ML INJ: 10.0000 mg | INTRAVENOUS | Status: DC | PRN

## 2015-06-12 MED ORDER — SERTRALINE HCL 25 MG PO TABS
25.0000 mg | ORAL_TABLET | Freq: Every day | ORAL | Status: DC
  Administered 2015-06-12: 25 mg via ORAL
  Filled 2015-06-12 (×2): qty 1

## 2015-06-12 MED ORDER — FLEET ENEMA 7-19 GM/118ML RE ENEM: 1.0000 | ENEMA | RECTAL | Status: DC | PRN

## 2015-06-12 MED ORDER — OXYTOCIN 40 UNITS IN LACTATED RINGERS INFUSION - SIMPLE MED
1.0000 m[IU]/min | INTRAVENOUS | Status: DC
  Administered 2015-06-12: 2 m[IU]/min via INTRAVENOUS

## 2015-06-12 MED ORDER — OXYCODONE-ACETAMINOPHEN 5-325 MG PO TABS: 2.0000 | ORAL_TABLET | ORAL | Status: DC | PRN

## 2015-06-12 MED ORDER — PHENYLEPHRINE 40 MCG/ML (10ML) SYRINGE FOR IV PUSH (FOR BLOOD PRESSURE SUPPORT)
80.0000 ug | PREFILLED_SYRINGE | INTRAVENOUS | Status: AC | PRN
  Administered 2015-06-13 (×3): 80 ug via INTRAVENOUS

## 2015-06-12 MED ORDER — OXYTOCIN 40 UNITS IN LACTATED RINGERS INFUSION - SIMPLE MED
INTRAVENOUS | Status: AC
  Filled 2015-06-12: qty 1000

## 2015-06-12 MED ORDER — DIPHENHYDRAMINE HCL 50 MG/ML IJ SOLN: 12.5000 mg | INTRAMUSCULAR | Status: DC | PRN

## 2015-06-12 MED ORDER — LACTATED RINGERS IV BOLUS (SEPSIS): 500.0000 mL | Freq: Once | INTRAVENOUS | Status: DC

## 2015-06-12 MED ORDER — OXYTOCIN BOLUS FROM INFUSION: 500.0000 mL | INTRAVENOUS | Status: DC

## 2015-06-12 MED ORDER — CLINDAMYCIN PHOSPHATE 900 MG/50ML IV SOLN
900.0000 mg | Freq: Three times a day (TID) | INTRAVENOUS | Status: DC
  Administered 2015-06-12 – 2015-06-13 (×2): 900 mg via INTRAVENOUS
  Filled 2015-06-12 (×3): qty 50

## 2015-06-12 MED ORDER — FENTANYL 2.5 MCG/ML BUPIVACAINE 1/10 % EPIDURAL INFUSION (WH - ANES)
14.0000 mL/h | INTRAMUSCULAR | Status: DC | PRN
  Administered 2015-06-12: 1 mL/h via EPIDURAL

## 2015-06-12 MED ORDER — LACTATED RINGERS IV SOLN: 500.0000 mL | Freq: Once | INTRAVENOUS | Status: DC

## 2015-06-12 MED ORDER — OXYCODONE-ACETAMINOPHEN 5-325 MG PO TABS: 1.0000 | ORAL_TABLET | ORAL | Status: DC | PRN

## 2015-06-12 MED ORDER — PHENYLEPHRINE 40 MCG/ML (10ML) SYRINGE FOR IV PUSH (FOR BLOOD PRESSURE SUPPORT)
80.0000 ug | PREFILLED_SYRINGE | INTRAVENOUS | Status: DC | PRN
  Administered 2015-06-13: 80 ug via INTRAVENOUS
  Filled 2015-06-12: qty 10

## 2015-06-12 MED ORDER — LACTATED RINGERS IV SOLN: 500.0000 mL | INTRAVENOUS | Status: DC | PRN

## 2015-06-12 MED ORDER — OXYTOCIN 40 UNITS IN LACTATED RINGERS INFUSION - SIMPLE MED: 2.5000 [IU]/h | INTRAVENOUS | Status: DC

## 2015-06-12 MED ORDER — FENTANYL 2.5 MCG/ML BUPIVACAINE 1/10 % EPIDURAL INFUSION (WH - ANES)
INTRAMUSCULAR | Status: AC
  Filled 2015-06-12: qty 125

## 2015-06-12 MED ORDER — PHENYLEPHRINE 40 MCG/ML (10ML) SYRINGE FOR IV PUSH (FOR BLOOD PRESSURE SUPPORT)
PREFILLED_SYRINGE | INTRAVENOUS | Status: AC
  Administered 2015-06-12: 80 ug
  Filled 2015-06-12: qty 20

## 2015-06-12 MED ORDER — LIDOCAINE HCL (PF) 1 % IJ SOLN: 30.0000 mL | INTRAMUSCULAR | Status: DC | PRN

## 2015-06-12 MED ORDER — TERBUTALINE SULFATE 1 MG/ML IJ SOLN: 0.2500 mg | Freq: Once | INTRAMUSCULAR | Status: DC | PRN

## 2015-06-12 MED ORDER — ONDANSETRON HCL 4 MG/2ML IJ SOLN: 4.0000 mg | Freq: Four times a day (QID) | INTRAMUSCULAR | Status: DC | PRN

## 2015-06-12 MED ORDER — LACTATED RINGERS IV SOLN
INTRAVENOUS | Status: DC
  Administered 2015-06-12 – 2015-06-13 (×2): via INTRAVENOUS

## 2015-06-12 MED ORDER — ACETAMINOPHEN 325 MG PO TABS: 650.0000 mg | ORAL_TABLET | ORAL | Status: DC | PRN

## 2015-06-12 NOTE — MAU Note (Signed)
Dr. Henderson Cloudomblin phoned after reviewing efm strip, informed of decel, will come to MAU to evaluate patient.

## 2015-06-12 NOTE — Anesthesia Preprocedure Evaluation (Addendum)
Anesthesia Evaluation  Patient identified by MRN, date of birth, ID band Patient awake    Reviewed: Allergy & Precautions, NPO status , Patient's Chart, lab work & pertinent test results  History of Anesthesia Complications Negative for: history of anesthetic complications  Airway Mallampati: II  TM Distance: >3 FB Neck ROM: Full    Dental no notable dental hx. (+) Dental Advisory Given   Pulmonary neg pulmonary ROS,    Pulmonary exam normal        Cardiovascular negative cardio ROS Normal cardiovascular exam     Neuro/Psych negative neurological ROS  negative psych ROS   GI/Hepatic negative GI ROS, Neg liver ROS,   Endo/Other  negative endocrine ROS  Renal/GU negative Renal ROS  negative genitourinary   Musculoskeletal negative musculoskeletal ROS (+)   Abdominal   Peds negative pediatric ROS (+)  Hematology negative hematology ROS (+)   Anesthesia Other Findings   Reproductive/Obstetrics (+) Pregnancy                             Anesthesia Physical Anesthesia Plan  ASA: II  Anesthesia Plan: Epidural and Spinal   Post-op Pain Management:    Induction:   Airway Management Planned: Natural Airway and Nasal Cannula  Additional Equipment:   Intra-op Plan:   Post-operative Plan:   Informed Consent: I have reviewed the patients History and Physical, chart, labs and discussed the procedure including the risks, benefits and alternatives for the proposed anesthesia with the patient or authorized representative who has indicated his/her understanding and acceptance.   Dental advisory given  Plan Discussed with: Anesthesiologist, CRNA and Surgeon  Anesthesia Plan Comments: (Patient for C/Section for arrest of descent. Will use intrathecal catheter for C/Section. Plan to leave intrathecal catheter in place for 24 hours then d/c. M. Malen GauzeFoster, MD)       Anesthesia Quick  Evaluation

## 2015-06-12 NOTE — Anesthesia Pain Management Evaluation Note (Signed)
  CRNA Pain Management Visit Note  Patient: Katherine Wilkerson, 35 y.o., female  "Hello I am a member of the anesthesia team at [facility]. We have an anesthesia team available at all times to provide care throughout the hospital, including epidural management and anesthesia for C-section. I don't know your plan for the delivery whether it a natural birth, water birth, IV sedation, nitrous supplementation, doula or epidural, but we want to meet your pain goals."   1.Was your pain managed to your expectations on prior hospitalizations?   No prior hospitalizations  2.What is your expectation for pain management during this hospitalization?     Epidural and IV pain meds  3.How can we help you reach that goal? IV pain medications, possible epidural  Record the patient's initial score and the patient's pain goal.   Pain: 0  Pain Goal: 5 [facility] wants you to be able to say your pain was always managed very well.  Katherine Wilkerson 06/12/2015

## 2015-06-12 NOTE — MAU Note (Signed)
FHR is the 80-90 for 4 minutes @1749 . Pt turned to L side and O2 applied. Will continue to monitor.

## 2015-06-12 NOTE — MAU Note (Signed)
FHR in the 115s with variables. Pt does not feel baby move. IV started and labs drawn per standing orders. Dr. Henderson Cloudomblin notified and will continue to monitor FHR tracing.

## 2015-06-12 NOTE — MAU Note (Signed)
Pt states she has only felt her baby move once today. Pt denies ctxs, vaginal bleeding, or LOF. Pt denies any pain or tenderness in her abdomen.

## 2015-06-12 NOTE — MAU Note (Signed)
Dr. Henderson Cloudomblin at the bedside to discuss POC with patient and FOB.

## 2015-06-12 NOTE — MAU Note (Signed)
Dr. Henderson Cloudomblin notified of BPP results and FHR tracing. Dr. Henderson Cloudomblin will review strip remotely and contact RN for orders.

## 2015-06-12 NOTE — Progress Notes (Signed)
Cx 2/90/-2 AROM clear FHT cat one UCs q2-3 min

## 2015-06-12 NOTE — H&P (Signed)
Katherine HueKristen Y Wilkerson is a 35 y.o. female presenting for decreased fetal movement. No ROM, no bleeding, no pain, no trauma. Maternal Medical History:  Fetal activity: Perceived fetal activity is decreased.      OB History    Gravida Para Term Preterm AB TAB SAB Ectopic Multiple Living   1              Past Medical History  Diagnosis Date  . Acne   . Anemia     iron deficiency   Past Surgical History  Procedure Laterality Date  . Tonsillectomy     Family History: family history includes Thyroid disease in her mother. Social History:  reports that she has never smoked. She does not have any smokeless tobacco history on file. She reports that she does not drink alcohol or use illicit drugs.   Prenatal Transfer Tool  Maternal Diabetes: No Genetic Screening: Normal Maternal Ultrasounds/Referrals: Normal Fetal Ultrasounds or other Referrals:  None Maternal Substance Abuse:  No Significant Maternal Medications:  None Significant Maternal Lab Results:  None Other Comments:  None  Review of Systems  Eyes: Negative for blurred vision.  Gastrointestinal: Negative for abdominal pain.  Neurological: Negative for headaches.    Dilation: 2 Effacement (%): 70 Station: -2 Exam by:: Dr. Henderson Cloudomblin Blood pressure 119/61, pulse 69, temperature 97.9 F (36.6 C), temperature source Oral, resp. rate 18, SpO2 100 %. Maternal Exam:  Uterine Assessment: Contraction strength is mild.  Contraction frequency is irregular.   Abdomen: Patient reports no abdominal tenderness. Fetal presentation: vertex     Fetal Exam Fetal Monitor Review: Pattern: accelerations present.       Physical Exam  Cardiovascular: Normal rate and regular rhythm.   Respiratory: Effort normal and breath sounds normal.  GI: Soft. There is no tenderness.  Neurological: She has normal reflexes.    Cx 2/70/-2/vtx FHT initially poor variability. Was given IV fluids and accelerations noted. Then BPP of 8/8 obtained. Upon  return to room a 3 minute audible deceleration noted. Now no further decelerations and accelerations are present  Prenatal labs: ABO, Rh:   Antibody:   Rubella:   RPR:    HBsAg:    HIV:    GBS: Positive (06/07 0000)   Assessment/Plan: [age] yo G1P0 @ 39 0/7 weeks with decreased fetal movement and a prolonged deceleration, favorable cervix D/W patient and husband. In view of gestational age and cervical status I recommend induction of labor D/W pitocin and risks including fetal distress, failed induction, emergency cesarean section Patient states she understands and agrees   Retta MacMBLIN II,Tiffiany Beadles E 06/12/2015, 6:49 PM

## 2015-06-13 ENCOUNTER — Encounter (HOSPITAL_COMMUNITY)
Admission: AD | Disposition: A | Discharge status: Home / Self Care | Payer: Self-pay | Source: Ambulatory Visit | Attending: Obstetrics & Gynecology

## 2015-06-13 ENCOUNTER — Encounter (HOSPITAL_COMMUNITY): Payer: Self-pay | Admitting: *Deleted

## 2015-06-13 DIAGNOSIS — Z98891 History of uterine scar from previous surgery: Secondary | ICD-10-CM

## 2015-06-13 LAB — ABO/RH: ABO/RH(D): O POS

## 2015-06-13 SURGERY — Surgical Case
Anesthesia: Epidural

## 2015-06-13 MED ORDER — PRENATAL MULTIVITAMIN CH
1.0000 | ORAL_TABLET | Freq: Every day | ORAL | Status: DC
  Filled 2015-06-13 (×2): qty 1

## 2015-06-13 MED ORDER — SODIUM CHLORIDE 0.9% FLUSH: 3.0000 mL | INTRAVENOUS | Status: DC | PRN

## 2015-06-13 MED ORDER — MORPHINE SULFATE (PF) 0.5 MG/ML IJ SOLN
INTRAMUSCULAR | Status: DC | PRN
  Administered 2015-06-13: .2 mg via INTRATHECAL

## 2015-06-13 MED ORDER — NALOXONE HCL 0.4 MG/ML IJ SOLN: 0.4000 mg | INTRAMUSCULAR | Status: DC | PRN

## 2015-06-13 MED ORDER — FERROUS SULFATE 325 (65 FE) MG PO TABS
325.0000 mg | ORAL_TABLET | Freq: Two times a day (BID) | ORAL | Status: DC
  Administered 2015-06-13 – 2015-06-16 (×6): 325 mg via ORAL
  Filled 2015-06-13 (×6): qty 1

## 2015-06-13 MED ORDER — NALOXONE HCL 2 MG/2ML IJ SOSY
1.0000 ug/kg/h | PREFILLED_SYRINGE | INTRAMUSCULAR | Status: DC | PRN
  Filled 2015-06-13: qty 2

## 2015-06-13 MED ORDER — WITCH HAZEL-GLYCERIN EX PADS: 1.0000 "application " | MEDICATED_PAD | CUTANEOUS | Status: DC | PRN

## 2015-06-13 MED ORDER — DIBUCAINE 1 % RE OINT: 1.0000 "application " | TOPICAL_OINTMENT | RECTAL | Status: DC | PRN

## 2015-06-13 MED ORDER — FENTANYL CITRATE (PF) 100 MCG/2ML IJ SOLN
25.0000 ug | INTRAMUSCULAR | Status: DC | PRN
  Administered 2015-06-13: 50 ug via INTRAVENOUS

## 2015-06-13 MED ORDER — DIPHENHYDRAMINE HCL 25 MG PO CAPS: 25.0000 mg | ORAL_CAPSULE | Freq: Four times a day (QID) | ORAL | Status: DC | PRN

## 2015-06-13 MED ORDER — FENTANYL CITRATE (PF) 100 MCG/2ML IJ SOLN
INTRAMUSCULAR | Status: AC
  Filled 2015-06-13: qty 2

## 2015-06-13 MED ORDER — ONDANSETRON HCL 4 MG/2ML IJ SOLN: 4.0000 mg | Freq: Three times a day (TID) | INTRAMUSCULAR | Status: DC | PRN

## 2015-06-13 MED ORDER — KETOROLAC TROMETHAMINE 30 MG/ML IJ SOLN: 30.0000 mg | Freq: Four times a day (QID) | INTRAMUSCULAR | Status: AC | PRN

## 2015-06-13 MED ORDER — KETOROLAC TROMETHAMINE 30 MG/ML IJ SOLN
30.0000 mg | Freq: Once | INTRAMUSCULAR | Status: AC
  Administered 2015-06-13: 30 mg via INTRAMUSCULAR

## 2015-06-13 MED ORDER — IBUPROFEN 600 MG PO TABS
600.0000 mg | ORAL_TABLET | Freq: Four times a day (QID) | ORAL | Status: DC
  Administered 2015-06-13 – 2015-06-16 (×11): 600 mg via ORAL
  Filled 2015-06-13 (×11): qty 1

## 2015-06-13 MED ORDER — SIMETHICONE 80 MG PO CHEW: 80.0000 mg | CHEWABLE_TABLET | ORAL | Status: DC | PRN

## 2015-06-13 MED ORDER — SODIUM BICARBONATE 8.4 % IV SOLN
INTRAVENOUS | Status: DC | PRN
  Administered 2015-06-13: 1.8 mL via EPIDURAL

## 2015-06-13 MED ORDER — SIMETHICONE 80 MG PO CHEW
80.0000 mg | CHEWABLE_TABLET | ORAL | Status: DC
  Administered 2015-06-13 – 2015-06-16 (×3): 80 mg via ORAL
  Filled 2015-06-13 (×3): qty 1

## 2015-06-13 MED ORDER — SENNOSIDES-DOCUSATE SODIUM 8.6-50 MG PO TABS
2.0000 | ORAL_TABLET | ORAL | Status: DC
  Administered 2015-06-13 – 2015-06-16 (×3): 2 via ORAL
  Filled 2015-06-13 (×3): qty 2

## 2015-06-13 MED ORDER — PHENYLEPHRINE 8 MG IN D5W 100 ML (0.08MG/ML) PREMIX OPTIME
INJECTION | INTRAVENOUS | Status: AC
  Filled 2015-06-13: qty 100

## 2015-06-13 MED ORDER — MEPERIDINE HCL 25 MG/ML IJ SOLN: 6.2500 mg | INTRAMUSCULAR | Status: DC | PRN

## 2015-06-13 MED ORDER — LACTATED RINGERS IV SOLN
INTRAVENOUS | Status: DC | PRN
  Administered 2015-06-13 (×2): via INTRAVENOUS

## 2015-06-13 MED ORDER — SCOPOLAMINE 1 MG/3DAYS TD PT72
1.0000 | MEDICATED_PATCH | Freq: Once | TRANSDERMAL | Status: DC
  Filled 2015-06-13: qty 1

## 2015-06-13 MED ORDER — PHENYLEPHRINE HCL 10 MG/ML IJ SOLN
INTRAMUSCULAR | Status: DC | PRN
  Administered 2015-06-13: 40 ug via INTRAVENOUS

## 2015-06-13 MED ORDER — ACETAMINOPHEN 325 MG PO TABS
650.0000 mg | ORAL_TABLET | ORAL | Status: DC | PRN
  Administered 2015-06-13: 650 mg via ORAL
  Filled 2015-06-13: qty 2

## 2015-06-13 MED ORDER — ONDANSETRON HCL 4 MG/2ML IJ SOLN
INTRAMUSCULAR | Status: DC | PRN
  Administered 2015-06-13: 4 mg via INTRAVENOUS

## 2015-06-13 MED ORDER — DIPHENHYDRAMINE HCL 25 MG PO CAPS: 25.0000 mg | ORAL_CAPSULE | ORAL | Status: DC | PRN

## 2015-06-13 MED ORDER — OXYCODONE-ACETAMINOPHEN 5-325 MG PO TABS
1.0000 | ORAL_TABLET | ORAL | Status: DC | PRN
  Administered 2015-06-13: 1 via ORAL
  Filled 2015-06-13: qty 1

## 2015-06-13 MED ORDER — NALBUPHINE HCL 10 MG/ML IJ SOLN: 5.0000 mg | INTRAMUSCULAR | Status: DC | PRN

## 2015-06-13 MED ORDER — OXYTOCIN 10 UNIT/ML IJ SOLN
INTRAMUSCULAR | Status: AC
  Filled 2015-06-13: qty 4

## 2015-06-13 MED ORDER — LIDOCAINE HCL (PF) 1 % IJ SOLN
INTRAMUSCULAR | Status: DC | PRN
  Administered 2015-06-12: 5 mL via EPIDURAL
  Administered 2015-06-12: 5 mL

## 2015-06-13 MED ORDER — SOD CITRATE-CITRIC ACID 500-334 MG/5ML PO SOLN
ORAL | Status: AC
  Administered 2015-06-13: 30 mL
  Filled 2015-06-13: qty 15

## 2015-06-13 MED ORDER — LACTATED RINGERS IV SOLN
INTRAVENOUS | Status: DC
  Administered 2015-06-13: 17:00:00 via INTRAVENOUS

## 2015-06-13 MED ORDER — SIMETHICONE 80 MG PO CHEW
80.0000 mg | CHEWABLE_TABLET | Freq: Three times a day (TID) | ORAL | Status: DC
  Administered 2015-06-13 – 2015-06-16 (×8): 80 mg via ORAL
  Filled 2015-06-13 (×8): qty 1

## 2015-06-13 MED ORDER — EPHEDRINE SULFATE 50 MG/ML IJ SOLN
INTRAMUSCULAR | Status: DC | PRN
  Administered 2015-06-13: 5 mg via INTRAVENOUS

## 2015-06-13 MED ORDER — NALBUPHINE HCL 10 MG/ML IJ SOLN
5.0000 mg | INTRAMUSCULAR | Status: DC | PRN
  Filled 2015-06-13: qty 1

## 2015-06-13 MED ORDER — DIPHENHYDRAMINE HCL 50 MG/ML IJ SOLN: 12.5000 mg | INTRAMUSCULAR | Status: DC | PRN

## 2015-06-13 MED ORDER — MENTHOL 3 MG MT LOZG: 1.0000 | LOZENGE | OROMUCOSAL | Status: DC | PRN

## 2015-06-13 MED ORDER — OXYTOCIN 10 UNIT/ML IJ SOLN
40.0000 [IU] | INTRAVENOUS | Status: DC | PRN
  Administered 2015-06-13: 40 [IU] via INTRAVENOUS

## 2015-06-13 MED ORDER — IBUPROFEN 600 MG PO TABS: 600.0000 mg | ORAL_TABLET | Freq: Four times a day (QID) | ORAL | Status: DC | PRN

## 2015-06-13 MED ORDER — OXYTOCIN 40 UNITS IN LACTATED RINGERS INFUSION - SIMPLE MED: 2.5000 [IU]/h | INTRAVENOUS | Status: AC

## 2015-06-13 MED ORDER — NALBUPHINE HCL 10 MG/ML IJ SOLN: 5.0000 mg | Freq: Once | INTRAMUSCULAR | Status: DC | PRN

## 2015-06-13 MED ORDER — ONDANSETRON HCL 4 MG/2ML IJ SOLN
INTRAMUSCULAR | Status: AC
  Filled 2015-06-13: qty 2

## 2015-06-13 MED ORDER — ZOLPIDEM TARTRATE 5 MG PO TABS: 5.0000 mg | ORAL_TABLET | Freq: Every evening | ORAL | Status: DC | PRN

## 2015-06-13 MED ORDER — KETOROLAC TROMETHAMINE 30 MG/ML IJ SOLN
INTRAMUSCULAR | Status: AC
  Filled 2015-06-13: qty 1

## 2015-06-13 MED ORDER — LIDOCAINE-EPINEPHRINE (PF) 2 %-1:200000 IJ SOLN
INTRAMUSCULAR | Status: AC
  Filled 2015-06-13: qty 20

## 2015-06-13 MED ORDER — LACTATED RINGERS IV SOLN
INTRAVENOUS | Status: DC | PRN
  Administered 2015-06-13: 10:00:00 via INTRAVENOUS

## 2015-06-13 MED ORDER — TETANUS-DIPHTH-ACELL PERTUSSIS 5-2.5-18.5 LF-MCG/0.5 IM SUSP: 0.5000 mL | Freq: Once | INTRAMUSCULAR | Status: DC

## 2015-06-13 MED ORDER — SODIUM CHLORIDE 0.9 % IR SOLN
Status: DC | PRN
  Administered 2015-06-13: 1

## 2015-06-13 MED ORDER — CLINDAMYCIN PHOSPHATE 900 MG/50ML IV SOLN
900.0000 mg | Freq: Once | INTRAVENOUS | Status: AC
  Administered 2015-06-13: 900 mg via INTRAVENOUS
  Filled 2015-06-13: qty 50

## 2015-06-13 MED ORDER — FENTANYL CITRATE (PF) 100 MCG/2ML IJ SOLN
INTRAMUSCULAR | Status: DC | PRN
  Administered 2015-06-13: 90 ug via INTRAVENOUS
  Administered 2015-06-13: 10 ug via INTRATHECAL

## 2015-06-13 MED ORDER — MORPHINE SULFATE (PF) 0.5 MG/ML IJ SOLN
INTRAMUSCULAR | Status: AC
  Filled 2015-06-13: qty 10

## 2015-06-13 MED ORDER — OXYCODONE-ACETAMINOPHEN 5-325 MG PO TABS
2.0000 | ORAL_TABLET | ORAL | Status: DC | PRN
  Administered 2015-06-14: 2 via ORAL
  Filled 2015-06-13: qty 2

## 2015-06-13 MED ORDER — COCONUT OIL OIL: 1.0000 "application " | TOPICAL_OIL | Status: DC | PRN

## 2015-06-13 SURGICAL SUPPLY — 32 items
APL SKNCLS STERI-STRIP NONHPOA (GAUZE/BANDAGES/DRESSINGS) ×1
BENZOIN TINCTURE PRP APPL 2/3 (GAUZE/BANDAGES/DRESSINGS) ×2 IMPLANT
CHLORAPREP W/TINT 26ML (MISCELLANEOUS) ×2 IMPLANT
CLAMP CORD UMBIL (MISCELLANEOUS) IMPLANT
CLOTH BEACON ORANGE TIMEOUT ST (SAFETY) ×2 IMPLANT
DRSG OPSITE POSTOP 4X10 (GAUZE/BANDAGES/DRESSINGS) ×2 IMPLANT
ELECT REM PT RETURN 9FT ADLT (ELECTROSURGICAL) ×2
ELECTRODE REM PT RTRN 9FT ADLT (ELECTROSURGICAL) ×1 IMPLANT
EXTRACTOR VACUUM KIWI (MISCELLANEOUS) IMPLANT
GLOVE BIO SURGEON STRL SZ 6 (GLOVE) ×2 IMPLANT
GLOVE BIOGEL PI IND STRL 6 (GLOVE) ×2 IMPLANT
GLOVE BIOGEL PI IND STRL 7.0 (GLOVE) ×1 IMPLANT
GLOVE BIOGEL PI INDICATOR 6 (GLOVE) ×2
GLOVE BIOGEL PI INDICATOR 7.0 (GLOVE) ×1
GOWN STRL REUS W/TWL LRG LVL3 (GOWN DISPOSABLE) ×4 IMPLANT
KIT ABG SYR 3ML LUER SLIP (SYRINGE) ×2 IMPLANT
LIQUID BAND (GAUZE/BANDAGES/DRESSINGS) IMPLANT
NDL HYPO 25X5/8 SAFETYGLIDE (NEEDLE) ×1 IMPLANT
NEEDLE HYPO 25X5/8 SAFETYGLIDE (NEEDLE) ×2 IMPLANT
NS IRRIG 1000ML POUR BTL (IV SOLUTION) ×2 IMPLANT
PACK C SECTION WH (CUSTOM PROCEDURE TRAY) ×2 IMPLANT
PAD OB MATERNITY 4.3X12.25 (PERSONAL CARE ITEMS) ×2 IMPLANT
PENCIL SMOKE EVAC W/HOLSTER (ELECTROSURGICAL) ×2 IMPLANT
STRIP CLOSURE SKIN 1/2X4 (GAUZE/BANDAGES/DRESSINGS) ×1 IMPLANT
SUT CHROMIC 0 CTX 36 (SUTURE) ×6 IMPLANT
SUT MON AB 2-0 CT1 27 (SUTURE) ×2 IMPLANT
SUT PDS AB 0 CT1 27 (SUTURE) IMPLANT
SUT PLAIN 0 NONE (SUTURE) IMPLANT
SUT VIC AB 0 CT1 36 (SUTURE) IMPLANT
SUT VIC AB 4-0 KS 27 (SUTURE) IMPLANT
TOWEL OR 17X24 6PK STRL BLUE (TOWEL DISPOSABLE) ×2 IMPLANT
TRAY FOLEY CATH SILVER 14FR (SET/KITS/TRAYS/PACK) IMPLANT

## 2015-06-13 NOTE — Lactation Note (Signed)
This note was copied from a baby's chart. Lactation Consultation Note Follow up visit at 9 hours of age.  Mom is eager to latch baby, but has nipple trauma to right nipple and is wanting to continue using NS.  LC assisted with hand expression and use of hand pump to help evert nipple.  Mom is able to apply NS with minimal assistance.  Baby latched well with wide flanged lips and rhythmic sucking.   Audible swallows are noted with strong sucking.  LC observed 10 minutes of sucking with colostrum noted in NS when baby came off and then re-latched.  Mom initially reported nipple pain that improved with better latching and more rhythmic sucking.  Report given to RN.  Mom to call for assist as needed.    Patient Name: Katherine Wilhemina BonitoKristen Wilkerson ZOXWR'UToday's Date: 06/13/2015 Reason for consult: Follow-up assessment   Maternal Data Has patient been taught Hand Expression?: Yes  Feeding Feeding Type: Breast Fed Length of feed:  (observed 10 minutes)  LATCH Score/Interventions Latch: Grasps breast easily, tongue down, lips flanged, rhythmical sucking. Intervention(s): Skin to skin;Teach feeding cues;Waking techniques Intervention(s): Breast compression;Breast massage  Audible Swallowing: Spontaneous and intermittent Intervention(s): Skin to skin;Hand expression  Type of Nipple: Flat Intervention(s): Hand pump  Comfort (Breast/Nipple): Filling, red/small blisters or bruises, mild/mod discomfort  Problem noted: Mild/Moderate discomfort Interventions  (Cracked/bleeding/bruising/blister): Hand pump Interventions (Mild/moderate discomfort): Pre-pump if needed;Hand expression  Hold (Positioning): Assistance needed to correctly position infant at breast and maintain latch. Intervention(s): Breastfeeding basics reviewed;Support Pillows;Position options;Skin to skin  LATCH Score: 7  Lactation Tools Discussed/Used Tools: Nipple Shields Nipple shield size: 20 Pump Review: Setup, frequency, and  cleaning Initiated by:: JS Date initiated:: 06/13/15   Consult Status Consult Status: Follow-up Date: 06/14/15 Follow-up type: In-patient    Shoptaw, Arvella MerlesJana Lynn 06/13/2015, 7:30 PM

## 2015-06-13 NOTE — Anesthesia Postprocedure Evaluation (Signed)
Anesthesia Post Note  Patient: Katherine Wilkerson  Procedure(s) Performed: Procedure(s) (LRB): CESAREAN SECTION (N/A)  Patient location during evaluation: Mother Baby Anesthesia Type: Epidural Level of consciousness: oriented and awake and alert Pain management: pain level controlled Vital Signs Assessment: post-procedure vital signs reviewed and stable Respiratory status: spontaneous breathing and nonlabored ventilation Cardiovascular status: stable Postop Assessment: epidural receding, patient able to bend at knees, no signs of nausea or vomiting and adequate PO intake Anesthetic complications: no     Last Vitals:  Filed Vitals:   06/13/15 1151 06/13/15 1250  BP: 104/50 114/83  Pulse: 60 60  Temp: 36.9 C 36.9 C  Resp: 18 17    Last Pain:  Filed Vitals:   06/13/15 1257  PainSc: 4    Pain Goal: Patients Stated Pain Goal: 3 (06/13/15 1250)               Wylan Gentzler Hristova

## 2015-06-13 NOTE — Progress Notes (Signed)
Late entry note from 0830  Patient had been effectively pushing x >2 hours with no descent.  Caput to +2 but vertex at +1; narrow pelvic outlet.  Patient is counseled re: recommendation for C/S.  She is informed of the risk of bleeding, infection, scarring and damage to surrounding structures.  She understands the 1% risk of uterine rupture in subsequent pregnancies.  All questions were answered and the patient wishes to proceed.  Mitchel HonourMegan James Lafalce, DO

## 2015-06-13 NOTE — Anesthesia Procedure Notes (Addendum)
intrathecal catherter Patient location during procedure: OB Start time: 06/12/2015 11:41 PM End time: 06/13/2015 12:02 AM  Staffing Anesthesiologist: Heather RobertsSINGER, Katherine Wilkerson Performed by: anesthesiologist   Preanesthetic Checklist Completed: patient identified, site marked, pre-op evaluation, timeout performed, IV checked, risks and benefits discussed and monitors and equipment checked  Epidural Patient position: sitting Prep: DuraPrep Patient monitoring: heart rate, cardiac monitor, continuous pulse ox and blood pressure Approach: midline Location: L2-L3 Injection technique: LOR saline  Needle:  Needle type: Tuohy  Needle gauge: 17 G Needle length: 9 cm Needle insertion depth: 6 cm Catheter size: 20 Guage Catheter at skin depth: 11 cm Test dose: Other and positive  Assessment Events: blood not aspirated, injection not painful, no injection resistance and negative IV test  Additional Notes Informed consent obtained prior to proceeding including risk of failure, 1% risk of PDPH, risk of minor discomfort and bruising.  Discussed rare but serious complications including epidural abscess, permanent nerve injury, epidural hematoma.  Discussed alternatives to epidural analgesia and patient desires to proceed.  Timeout performed pre-procedure verifying patient name, procedure, and platelet count.  Patient tolerated procedure well. Pt became very numb with test dose despite negative aspiration.  Catheter seems to be intrathecal.

## 2015-06-13 NOTE — Transfer of Care (Signed)
Immediate Anesthesia Transfer of Care Note  Patient: Katherine Wilkerson  Procedure(s) Performed: Procedure(s): CESAREAN SECTION (N/A)  Patient Location: PACU  Anesthesia Type:Spinal  Level of Consciousness: awake, alert  and oriented  Airway & Oxygen Therapy: Patient Spontanous Breathing  Post-op Assessment: Report given to RN and Post -op Vital signs reviewed and stable  Post vital signs: Reviewed and stable  Last Vitals:  Filed Vitals:   06/13/15 0907 06/13/15 0908  BP: 95/42 95/42  Pulse: 57 57  Temp:    Resp:      Last Pain:  Filed Vitals:   06/13/15 1003  PainSc: 0-No pain      Patients Stated Pain Goal: 5 (06/12/15 1931)  Complications: No apparent anesthesia complications

## 2015-06-13 NOTE — Progress Notes (Signed)
Patient requested that epidural be turned off saying she felt "too numb". Dr. Krista BlueSinger aware. Will continue to monitor. Irving BurtonEmily Tyjay Galindo RN

## 2015-06-13 NOTE — Progress Notes (Signed)
Katherine HueKristen Y Wilkerson is a 35 y.o. G1P0 at 3468w1d by ultrasound admitted for IOL secondary to deceleration x 3 minutes in MAU.   Subjective: Feeling pressure with each CTX but otherwise comfortable with epidural.  Has been pushing since 615.    Objective: BP 93/58 mmHg  Pulse 66  Temp(Src) 98.3 F (36.8 C) (Oral)  Resp 18  Ht 5\' 1"  (1.549 m)  Wt 142 lb (64.411 kg)  BMI 26.84 kg/m2  SpO2 100%      FHT:  FHR: 135 bpm, variability: moderate,  accelerations:  Abscent,  decelerations:  Absent UC:   regular, every 2 minutes SVE:   Dilation: 10 Effacement (%): 100 Station: +2 Exam by:: Irving BurtonEmily Rothermel RN   Narrow pelvic outlet  Labs: Lab Results  Component Value Date   WBC 7.1 06/12/2015   HGB 9.8* 06/12/2015   HCT 30.7* 06/12/2015   MCV 84.3 06/12/2015   PLT 209 06/12/2015    Assessment / Plan: Induction of labor due to non-reassuring fetal testing,  progressing well on pitocin  Labor: Progressing normally.  Will reassess in 30 minutes. Preeclampsia:  n/a Fetal Wellbeing:  Category II Pain Control:  Epidural I/D:  n/a Anticipated MOD:  NSVD  Will Heinkel 06/13/2015, 7:58 AM

## 2015-06-13 NOTE — Op Note (Signed)
Dixon BoosKristen Y Rieser PROCEDURE DATE: 06/12/2015 - 06/13/2015  PREOPERATIVE DIAGNOSIS: Intrauterine pregnancy at  8032w1d weeks gestation, arrest of descent  POSTOPERATIVE DIAGNOSIS: The same  PROCEDURE: Primary Low Transverse Cesarean Section  SURGEON:  Dr. Mitchel HonourMegan Karesa Maultsby  INDICATIONS: Valentino HueKristen Y Coker is a 35 y.o. G1P0 at 4632w1d scheduled for cesarean section secondary to arrest of descent at +1 station, >2 hours of pushing.  The risks of cesarean section discussed with the patient included but were not limited to: bleeding which may require transfusion or reoperation; infection which may require antibiotics; injury to bowel, bladder, ureters or other surrounding organs; injury to the fetus; need for additional procedures including hysterectomy in the event of a life-threatening hemorrhage; placental abnormalities wth subsequent pregnancies, incisional problems, thromboembolic phenomenon and other postoperative/anesthesia complications. The patient concurred with the proposed plan, giving informed written consent for the procedure.    FINDINGS:  Viable female infant in cephalic presentation, APGARs 8,9:  Weight pending  Clear amniotic fluid.  Intact placenta, three vessel cord.  Grossly normal uterus, ovaries and fallopian tubes. .   ANESTHESIA: Epidural ESTIMATED BLOOD LOSS: 600 ml SPECIMENS: Placenta sent to L&D COMPLICATIONS: None immediate  PROCEDURE IN DETAIL:  The patient received intravenous antibiotics and had sequential compression devices applied to her lower extremities while in the preoperative area.  She was then taken to the operating room where epidural anesthesia was dosed up to surgical level and was found to be adequate. She was then placed in a dorsal supine position with a leftward tilt, and prepped and draped in a sterile manner.  A foley catheter was placed into her bladder and attached to constant gravity.  After an adequate timeout was performed, a Pfannenstiel skin incision was made  with scalpel and carried through to the underlying layer of fascia. The fascia was incised in the midline and this incision was extended bilaterally using the Mayo scissors. Kocher clamps were applied to the superior aspect of the fascial incision and the underlying rectus muscles were dissected off bluntly. A similar process was carried out on the inferior aspect of the facial incision. The rectus muscles were separated in the midline bluntly and the peritoneum was entered bluntly.   Bladder flap was created sharply and developed bluntly.  Bladder blade was placed.  A transverse hysterotomy was made with a scalpel and extended bilaterally bluntly. The bladder blade was then removed. The infant was successfully delivered, and cord was clamped and cut and infant was handed over to awaiting neonatology team. Uterine massage was then administered and the placenta delivered intact with three-vessel cord. The uterus was cleared of clot and debris.  The hysterotomy was closed with 0 chromic.  A second imbricating suture of 0-chromic was used to reinforce the incision and aid in hemostasis.  The peritoneum and rectus muscles were noted to be hemostatic and were reapproximated using 3-0 monocryl.  The fascia was closed with 0-Vicryl in a running fashion with good restoration of anatomy.  The subcutaneus tissue was copiously irrigated.  The skin was closed with 4-0 Vicryl in a subcuticular fashion.  Pt tolerated the procedure will.  All counts were correct x2.  Pt went to the recovery room in stable condition.

## 2015-06-13 NOTE — Lactation Note (Signed)
This note was copied from a baby's chart. Lactation Consultation Note  Patient Name: Boy Wilhemina BonitoKristen Byard NWGNF'AToday's Date: 06/13/2015 Reason for consult: Initial assessment  Mom called out for assist. Mom's R nipple already has beginning of a compression stripe & 2 tiny "scabs" from previous latch (Mom is 5 hrs postpartum). "Clydene Pughsher" put to L breast. Mom's nipples are slightly short-shafted. Clydene Pughsher was trying to latch & might have been able to do so fully, but Mom felt that he was "biting." His latch was adjusted, but no improvement was noted. Infant removed from breast due to Mom's discomfort.   A nipple shield (size 20) was applied while infant lay prone on Mom's chest. Swallows were immediately noted, but Mom felt too much discomfort so we removed infant from breast. He relatched w/ease while in a sitting upright, football hold position. Swallows were immediately noted & Mom began to feel more comfortable. Dad shown how to support infant's body if Clydene Pughsher needs to relatch in a similar position. Mom's infant was noted to be blanched when infant released latch. This was mentioned to Mom--will continue to monitor.   Specifics of an asymmetric latch shown via [employer] website animation to Dad so he can help Mom w/latch. Comfort Gels placed in room & to be discussed w/Mom after she has rested and is able to wear a bra or tank top to keep them in place.   Mom's areolas are pinkish in color. Mom reports that is her typical coloring. LC to f/u later this evening to assist w/additional feedings.  Lurline HareRichey, Skylie Hiott [facility] 06/13/2015, 2:57 PM

## 2015-06-13 NOTE — Lactation Note (Signed)
This note was copied from a baby's chart. Lactation Consultation Note  Patient Name: Katherine Wilkerson ZOXWR'UToday's Date: 06/13/2015 Reason for consult: Initial assessment  Initial visit attempted at 4 hours of life. Mom is exhausted & would prefer to have an [facility] consult after she has rested. Brochure left in room. LC to f/u later today.  Lurline HareRichey, Kamea Dacosta [facility] 06/13/2015, 2:06 PM

## 2015-06-13 NOTE — Addendum Note (Signed)
Addendum  created 06/13/15 1258 by Elgie CongoNataliya H Delonta Yohannes, CRNA   Modules edited: Charges VN, Clinical Notes   Clinical Notes:  File: [id number]

## 2015-06-13 NOTE — Anesthesia Postprocedure Evaluation (Signed)
Anesthesia Post Note  Patient: Katherine Wilkerson  Procedure(s) Performed: Procedure(s) (LRB): CESAREAN SECTION (N/A)  Patient location during evaluation: PACU Anesthesia Type: Spinal Level of consciousness: awake and alert and oriented Pain management: pain level controlled Vital Signs Assessment: post-procedure vital signs reviewed and stable Respiratory status: spontaneous breathing, nonlabored ventilation and respiratory function stable Cardiovascular status: blood pressure returned to baseline and stable Postop Assessment: no headache, no backache, spinal receding, patient able to bend at knees and no signs of nausea or vomiting Anesthetic complications: no     Last Vitals:  Filed Vitals:   06/13/15 1037 06/13/15 1038  BP:    Pulse: 69 72  Temp:    Resp: 14 24    Last Pain:  Filed Vitals:   06/13/15 1038  PainSc: 6    Pain Goal: Patients Stated Pain Goal: 5 (06/12/15 1931)               Valerie Cones A.

## 2015-06-13 NOTE — Progress Notes (Signed)
On assessment, serosanguinous fluid noted on pressure dressing.  Pressure dressing lifted and underlying honeycomb noted to be saturated with serosanguinous drainage.  Dr. Langston MaskerMorris notified and gave order to change dressing.

## 2015-06-14 LAB — CBC
HCT: 21.9 % — ABNORMAL LOW (ref 36.0–46.0)
Hemoglobin: 7.2 g/dL — ABNORMAL LOW (ref 12.0–15.0)
MCH: 27.2 pg (ref 26.0–34.0)
MCHC: 32.9 g/dL (ref 30.0–36.0)
MCV: 82.6 fL (ref 78.0–100.0)
PLATELETS: 157 10*3/uL (ref 150–400)
RBC: 2.65 MIL/uL — ABNORMAL LOW (ref 3.87–5.11)
RDW: 15.2 % (ref 11.5–15.5)
WBC: 12.4 10*3/uL — AB (ref 4.0–10.5)

## 2015-06-14 LAB — BIRTH TISSUE RECOVERY COLLECTION (PLACENTA DONATION)

## 2015-06-14 LAB — RPR: RPR Ser Ql: NONREACTIVE

## 2015-06-14 MED ORDER — OXYCODONE HCL 5 MG PO TABS: 5.0000 mg | ORAL_TABLET | ORAL | Status: DC | PRN

## 2015-06-14 MED ORDER — BUTALBITAL-APAP-CAFFEINE 50-325-40 MG PO TABS
1.0000 | ORAL_TABLET | ORAL | Status: DC | PRN
  Administered 2015-06-14 – 2015-06-15 (×6): 2 via ORAL
  Filled 2015-06-14 (×2): qty 1
  Filled 2015-06-14 (×5): qty 2

## 2015-06-14 MED ORDER — LACTATED RINGERS IV BOLUS (SEPSIS)
500.0000 mL | Freq: Once | INTRAVENOUS | Status: AC
  Administered 2015-06-14: 500 mL via INTRAVENOUS

## 2015-06-14 NOTE — Progress Notes (Signed)
Subjective: Postpartum Day 1: Cesarean Delivery Patient reports tolerating PO and no problems voiding.  Complains of HA , increased with sitting. Dr. Malen GauzeFoster in to talk with patient regarding wet tap. Epidural catheter removed with small fluid noted.   Objective: Vital signs in last 24 hours: Temp:  [97.6 F (36.4 C)-98.7 F (37.1 C)] 97.6 F (36.4 C) (06/13 0516) Pulse Rate:  [52-81] 81 (06/13 0516) Resp:  [12-24] 16 (06/13 0516) BP: (67-114)/(23-83) 94/56 mmHg (06/13 0516) SpO2:  [96 %-100 %] 98 % (06/13 0516)  Physical Exam:  General: alert and cooperative Lochia: appropriate Uterine Fundus: firm Incision: abd pressure dressing noted with small drainage, dressing removed and honeycomb dressing saturated, all removed and replaced. No active bleeding noted DVT Evaluation: No evidence of DVT seen on physical exam. Negative Homan's sign. No cords or calf tenderness. No significant calf/ankle edema.   Recent Labs  06/12/15 1555 06/14/15 0518  HGB 9.8* 7.2*  HCT 30.7* 21.9*    Assessment/Plan: Status post Cesarean section. Postoperative course complicated by spinal HA  See orders per Dr. Malen GauzeFoster, will follow.  Katherine Wilkerson 06/14/2015, 8:12 AM

## 2015-06-14 NOTE — Addendum Note (Signed)
Addendum  created 06/14/15 0828 by Mal AmabileMichael Lorren Splawn, MD   Modules edited: Clinical Notes   Clinical Notes:  File: [id number]; Pend: [id number]; Pend: [id number]

## 2015-06-14 NOTE — Progress Notes (Signed)
Patient with hx/o "wet tap" yesterday, intrathecal catheter placed. She had a C/Section delivery using the intrathecal catheter. Intrathecal catheter left in situ after the procedure. She developed a HA described as Frontal in location moderately severe this am, worse on assuming the upright position. I D/C'd the Intrathecal catheter, tip intact. There was leaking of clear fluid around the catheter insertion site. A Band Aid was placed over the site. I explained to the patient she had an increased risk of developing a severe HA later today or tomorrow. I discussed in detail the cause and the various methods of treatment of a PDPHA. We will continue to observe her today. Use Fiorcet for HA or pain. Restart IV fluids. Increase intake of caffinenated beverages. Will do lumbar epidural blood patch only if HA becomes severe.

## 2015-06-14 NOTE — Progress Notes (Signed)
Pt. Complaining of headache throughout night. Describes headache as a 8/10 when sitting/standing and no pain when laying. When medicated rates pain 5/10 while sitting/standing. Anesthesia notified and plans to round on pt. This morning.

## 2015-06-15 ENCOUNTER — Inpatient Hospital Stay (HOSPITAL_COMMUNITY): Payer: 59 | Admitting: Anesthesiology

## 2015-06-15 LAB — CBC
HCT: 23.5 % — ABNORMAL LOW (ref 36.0–46.0)
Hemoglobin: 7.5 g/dL — ABNORMAL LOW (ref 12.0–15.0)
MCH: 26.7 pg (ref 26.0–34.0)
MCHC: 31.9 g/dL (ref 30.0–36.0)
MCV: 83.6 fL (ref 78.0–100.0)
PLATELETS: 206 10*3/uL (ref 150–400)
RBC: 2.81 MIL/uL — ABNORMAL LOW (ref 3.87–5.11)
RDW: 15.1 % (ref 11.5–15.5)
WBC: 12.1 10*3/uL — ABNORMAL HIGH (ref 4.0–10.5)

## 2015-06-15 MED ORDER — FENTANYL CITRATE (PF) 100 MCG/2ML IJ SOLN: 100.0000 ug | Freq: Once | INTRAMUSCULAR | Status: DC | PRN

## 2015-06-15 NOTE — Anesthesia Preprocedure Evaluation (Signed)
Anesthesia Evaluation  Patient identified by MRN, date of birth, ID band Patient awake    Reviewed: Allergy & Precautions, NPO status , Patient's Chart, lab work & pertinent test results  History of Anesthesia Complications Negative for: history of anesthetic complications  Airway Mallampati: II  TM Distance: >3 FB Neck ROM: Full    Dental no notable dental hx. (+) Dental Advisory Given   Pulmonary neg pulmonary ROS,    Pulmonary exam normal        Cardiovascular negative cardio ROS Normal cardiovascular exam     Neuro/Psych negative neurological ROS  negative psych ROS   GI/Hepatic negative GI ROS, Neg liver ROS,   Endo/Other  negative endocrine ROS  Renal/GU negative Renal ROS  negative genitourinary   Musculoskeletal negative musculoskeletal ROS (+)   Abdominal   Peds negative pediatric ROS (+)  Hematology negative hematology ROS (+)   Anesthesia Other Findings   Reproductive/Obstetrics                             Anesthesia Physical Anesthesia Plan  ASA: II  Anesthesia Plan: Epidural   Post-op Pain Management:    Induction:   Airway Management Planned:   Additional Equipment:   Intra-op Plan:   Post-operative Plan:   Informed Consent: I have reviewed the patients History and Physical, chart, labs and discussed the procedure including the risks, benefits and alternatives for the proposed anesthesia with the patient or authorized representative who has indicated his/her understanding and acceptance.   Dental advisory given  Plan Discussed with:   Anesthesia Plan Comments: (Patient with known intrathecal catheter placed on labor deck.  Intrathecal catheter removed on 06/14/15.  Patient with frontal headache, positional in nature consistent with post dural puncture headache.  Plan is for epidural blood patch to be done in PACU. )        Anesthesia Quick  Evaluation

## 2015-06-15 NOTE — Anesthesia Procedure Notes (Signed)
Epidural Patient location during procedure: OB  Staffing Anesthesiologist: Cecile HearingURK, STEPHEN EDWARD Performed by: anesthesiologist   Preanesthetic Checklist Completed: patient identified, pre-op evaluation, timeout performed, IV checked, risks and benefits discussed and monitors and equipment checked  Epidural Patient position: sitting Prep: DuraPrep Patient monitoring: blood pressure and continuous pulse ox Approach: midline Location: L3-L4 Injection technique: LOR air  Needle:  Needle type: Tuohy  Needle gauge: 17 G Needle length: 9 cm Needle insertion depth: 5 cm Epidural test dose: 1% Lidocaine.  Additional Notes EPIDURAL BLOOD PATCH: Tuohy advanced into epidural space in sterile fashion after timeout performed and consent reviewed with patient.  LOR to air at 5cm.  Sterile IV started in left hand and 20cc blood removed in sterile fashion.  Slow injection of blood via tuohy into epidural space. Total of 15cc of blood injected into epidural space.  Procedure halted due to pressure in back.  Patient laid flat and monitored for 30 minutes in PACU.  Please refer to nursing vital signs.  Patient sat up slowly and noted interval improvement in headache. Reason for block:Epidural Blood Patch

## 2015-06-15 NOTE — Progress Notes (Signed)
Subjective: Postpartum Day 2: Cesarean Delivery Patient reports tolerating PO, + flatus and no problems voiding.   Continues with spinal HA, considering blood patch Objective: Vital signs in last 24 hours: Temp:  [98 F (36.7 C)-98.2 F (36.8 C)] 98 F (36.7 C) (06/14 0645) Pulse Rate:  [70-76] 70 (06/14 0645) Resp:  [16-18] 18 (06/14 0645) BP: (100-107)/(46-49) 100/46 mmHg (06/14 0645) SpO2:  [99 %] 99 % (06/13 0845)  Physical Exam:  General: alert and cooperative Lochia: appropriate Uterine Fundus: firm Incision: healing well DVT Evaluation: No evidence of DVT seen on physical exam. Negative Homan's sign. No cords or calf tenderness. No significant calf/ankle edema.   Recent Labs  06/12/15 1555 06/14/15 0518  HGB 9.8* 7.2*  HCT 30.7* 21.9*    Assessment/Plan: Status post Cesarean section. Postoperative course complicated by spinal HA  Continue current care.  Alton Tremblay G 06/15/2015, 7:37 AM

## 2015-06-15 NOTE — Lactation Note (Signed)
This note was copied from a baby's chart. Lactation Consultation Note  Patient Name: Boy Wilhemina BonitoKristen Barbary RUEAV'WToday's Date: 06/15/2015 Reason for consult: Follow-up assessment  At time of consult, "Christell Faithrcher" had not likely had a good feeding in 9 hours (the last 2 breast feedings had not gone well, per parents). Parents were interested in trying a mode of supplement that did not use a bottle. Cup feeding was tried, but Clydene Pughsher only took a few mL. In light of his age & time since last good feeding, I encouraged parents to offer a bottle to see if he would consume more.   I taught Dad how to do paced bottle-feeding, which he did very well. Christell Faithrcher took 37 more mL of formula with ease.  Mom encouraged to offer the breast at subsequent feedings as she is able (Mom has an epidural HA). Mom encouraged to pump whenever Clydene Pughsher receives formula.   DEBP set up by RN. Mom had been using size 24 flanges, but they appeared to too large for her nipple diameter while she was pumping at the beginning of our consult. Size 21 flanges provided for the next time she pumps.     Lurline HareRichey, [facility] 06/15/2015, 12:39 PM

## 2015-06-16 LAB — CBC
HEMATOCRIT: 20.6 % — AB (ref 36.0–46.0)
Hemoglobin: 6.9 g/dL — CL (ref 12.0–15.0)
MCH: 27.6 pg (ref 26.0–34.0)
MCHC: 33.5 g/dL (ref 30.0–36.0)
MCV: 82.4 fL (ref 78.0–100.0)
Platelets: 205 10*3/uL (ref 150–400)
RBC: 2.5 MIL/uL — ABNORMAL LOW (ref 3.87–5.11)
RDW: 15.1 % (ref 11.5–15.5)
WBC: 8.8 10*3/uL (ref 4.0–10.5)

## 2015-06-16 MED ORDER — BUTALBITAL-APAP-CAFFEINE 50-325-40 MG PO TABS: 1.0000 | ORAL_TABLET | ORAL | Status: DC | PRN

## 2015-06-16 MED ORDER — FERROUS SULFATE 325 (65 FE) MG PO TABS: 325.0000 mg | ORAL_TABLET | Freq: Two times a day (BID) | ORAL | Status: DC

## 2015-06-16 MED ORDER — IBUPROFEN 600 MG PO TABS: 600.0000 mg | ORAL_TABLET | Freq: Four times a day (QID) | ORAL | Status: DC

## 2015-06-16 NOTE — Discharge Summary (Signed)
Obstetric Discharge Summary Reason for Admission: observation/evaluation Prenatal Procedures: ultrasound Intrapartum Procedures: cesarean: low cervical, transverse Postpartum Procedures: blood patch Complications-Operative and Postpartum: spinal headache HEMOGLOBIN  Date Value Ref Range Status  06/16/2015 6.9* 12.0 - 15.0 g/dL Final    Comment:    REPEATED TO VERIFY CRITICAL RESULT CALLED TO, READ BACK BY AND VERIFIED WITH: RN SCOTT,D @ 0626 ON 06/16/15 BY FULKS,C   11/09/2013 13.1 12.2 - 16.2 g/dL Final   HCT  Date Value Ref Range Status  06/16/2015 20.6* 36.0 - 46.0 % Final   HCT, POC  Date Value Ref Range Status  11/09/2013 40.3 37.7 - 47.9 % Final    Physical Exam:  General: alert and cooperative ambulating with dizziness or SOB Lochia: appropriate Uterine Fundus: firm Incision: healing well DVT Evaluation: No evidence of DVT seen on physical exam. Negative Homan's sign. No cords or calf tenderness. No significant calf/ankle edema.  Discharge Diagnoses: Term Pregnancy-delivered  Discharge Information: Date: 06/16/2015 Activity: pelvic rest Diet: routine Medications: PNV, Ibuprofen, Iron and fioricet and zoloft Condition: stable Instructions: refer to practice specific booklet Discharge to: home   Newborn Data: Live born female  Birth Weight: 6 lb 8.8 oz (2970 g) APGAR: 8, 9  Home with mother.  CURTIS,CAROL G 06/16/2015, 8:10 AM

## 2015-06-16 NOTE — Progress Notes (Signed)
Follow Up Note: Blood Patch Procedure  Date of Procedure: 06/15/2015  S:  No acute complaints. HA is drastically improved with ambulation.  O: AF, VSS Skin: intact, no erythema or drainage noted Neuro: intact, no gross deficits  A/P: 35 y/o F s/p epidural blood patch. - cont rest, hydration and caffeine supplementation as needed - cont ambulation - no further management per anesthesiology  Kaylyn LayerKevin D. Hart RochesterHollis, MD, [facility] Anesthesiology

## 2015-06-16 NOTE — Progress Notes (Signed)
CRITICAL VALUE ALERT  Critical value received:  Hgb 6.9  Date of notification:  06/16/15  Time of notification:  0630  Critical value read back:Yes.    Nurse who received alert:  Caprice Beaveranielle Cadden Elizondo RN   MD notified (1st page):  Tomblin MD  Time of first page:  [phone]   MD notified (2nd page):  Time of second page:  Responding MD:  Henderson Cloudomblin  Time MD responded:  [id number], no new orders at this time

## 2015-06-16 NOTE — Lactation Note (Signed)
This note was copied from a baby's chart. Lactation Consultation Note  Patient Name: Katherine Wilhemina BonitoKristen Sandora ZOXWR'UToday's Date: 06/16/2015   Visited with Mom and FOB on day of discharge, baby 6773 hrs old.  Mom has been choosing to offer bottle supplements as well as breast feed baby.  Encouraged Mom to keep baby skin to skin, and feed him >8 times in 24 hrs.  Encouraged to pump both breasts 15-20 minutes if baby receives formula.  Talked about basics and Mom had a lot of questions.  Engorgement prevention and treatment discussed.  Reminded Mom of OP lactation services available to her and encouraged her to call prn.      Judee ClaraSmith, Jinny Sweetland E 06/16/2015, 10:45 AM

## 2016-10-29 ENCOUNTER — Ambulatory Visit (INDEPENDENT_AMBULATORY_CARE_PROVIDER_SITE_OTHER): Payer: 59 | Admitting: Family Medicine

## 2016-10-29 ENCOUNTER — Encounter: Payer: Self-pay | Admitting: Family Medicine

## 2016-10-29 VITALS — BP 90/60 | HR 74 | Temp 98.3°F | Ht 60.5 in | Wt 127.4 lb

## 2016-10-29 DIAGNOSIS — Z0001 Encounter for general adult medical examination with abnormal findings: Secondary | ICD-10-CM

## 2016-10-29 DIAGNOSIS — Z8659 Personal history of other mental and behavioral disorders: Secondary | ICD-10-CM

## 2016-10-29 DIAGNOSIS — Z13220 Encounter for screening for lipoid disorders: Secondary | ICD-10-CM | POA: Diagnosis not present

## 2016-10-29 DIAGNOSIS — F4321 Adjustment disorder with depressed mood: Secondary | ICD-10-CM

## 2016-10-29 DIAGNOSIS — Z131 Encounter for screening for diabetes mellitus: Secondary | ICD-10-CM

## 2016-10-29 NOTE — Patient Instructions (Addendum)
Preventive Care 18-39 Years, Female Preventive care refers to lifestyle choices and visits with your health care provider that can promote health and wellness. What does preventive care include?  A yearly physical exam. This is also called an annual well check.  Dental exams once or twice a year.  Routine eye exams. Ask your health care provider how often you should have your eyes checked.  Personal lifestyle choices, including: ? Daily care of your teeth and gums. ? Regular physical activity. ? Eating a healthy diet. ? Avoiding tobacco and drug use. ? Limiting alcohol use. ? Practicing safe sex. ? Taking vitamin and mineral supplements as recommended by your health care provider. What happens during an annual well check? The services and screenings done by your health care provider during your annual well check will depend on your age, overall health, lifestyle risk factors, and family history of disease. Counseling Your health care provider may ask you questions about your:  Alcohol use.  Tobacco use.  Drug use.  Emotional well-being.  Home and relationship well-being.  Sexual activity.  Eating habits.  Work and work Statistician.  Method of birth control.  Menstrual cycle.  Pregnancy history.  Screening You may have the following tests or measurements:  Height, weight, and BMI.  Diabetes screening. This is done by checking your blood sugar (glucose) after you have not eaten for a while (fasting).  Blood pressure.  Lipid and cholesterol levels. These may be checked every 5 years starting at age 66.  Skin check.  Hepatitis C blood test.  Hepatitis B blood test.  Sexually transmitted disease (STD) testing.  BRCA-related cancer screening. This may be done if you have a family history of breast, ovarian, tubal, or peritoneal cancers.  Pelvic exam and Pap test. This may be done every 3 years starting at age 40. Starting at age 59, this may be done every 5  years if you have a Pap test in combination with an HPV test.  Discuss your test results, treatment options, and if necessary, the need for more tests with your health care provider. Vaccines Your health care provider may recommend certain vaccines, such as:  Influenza vaccine. This is recommended every year.  Tetanus, diphtheria, and acellular pertussis (Tdap, Td) vaccine. You may need a Td booster every 10 years.  Varicella vaccine. You may need this if you have not been vaccinated.  HPV vaccine. If you are 69 or younger, you may need three doses over 6 months.  Measles, mumps, and rubella (MMR) vaccine. You may need at least one dose of MMR. You may also need a second dose.  Pneumococcal 13-valent conjugate (PCV13) vaccine. You may need this if you have certain conditions and were not previously vaccinated.  Pneumococcal polysaccharide (PPSV23) vaccine. You may need one or two doses if you smoke cigarettes or if you have certain conditions.  Meningococcal vaccine. One dose is recommended if you are age 27-21 years and a first-year college student living in a residence hall, or if you have one of several medical conditions. You may also need additional booster doses.  Hepatitis A vaccine. You may need this if you have certain conditions or if you travel or work in places where you may be exposed to hepatitis A.  Hepatitis B vaccine. You may need this if you have certain conditions or if you travel or work in places where you may be exposed to hepatitis B.  Haemophilus influenzae type b (Hib) vaccine. You may need this if  you have certain risk factors.  Talk to your health care provider about which screenings and vaccines you need and how often you need them. This information is not intended to replace advice given to you by your health care provider. Make sure you discuss any questions you have with your health care provider. Document Released: 02/13/2001 Document Revised: 09/07/2015  Document Reviewed: 10/19/2014 Elsevier Interactive Patient Education  2017 [employer].

## 2016-10-29 NOTE — Progress Notes (Signed)
Patient presents to clinic today for CPE.  SUBJECTIVE: PMH:  Pt is a 36 yo female with pmh sig for iron deficiency anemia. She was formerly seen by Rudi Heap, MD.   Pt states she has been healthy overall.  Pt is followed by OB/GYN, Dr. Renaldo Fiddler.  Last Pap October 2017.  Grief: -Patient has history of depression and postpartum (started in last trimester)  -not currently on meds -Pt's father dies suddenly in 06-23-2022 from Colon Ca with mets -Not feeling depressed but wants to make sure she addresses things before they become a problem.  Allergies: Keflex, amoxicillin, doxycycline, Septra-unsure, had a reaction as a child.  One of the listed medications cause breathing issues all the others cause rash. Gluten intolerance Lactose intolerance  Past surgical history: Tonsillectomy C-section  Social history: Patient has been married for 3 years. She has a 41-month-old son named Clydene Pugh. 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 Patient denies tobacco, alcohol, drug use.  Family medical history: Mom-alive Dad-deceased 06-22-2016), colon cancer with metastases Other family members AAW including a brother, Annette Stable  Past Medical History:  Diagnosis Date  . Acne   . Anemia    iron deficiency    Past Surgical History:  Procedure Laterality Date  . CESAREAN SECTION N/A 06/13/2015   Procedure: CESAREAN SECTION;  Surgeon: Mitchel Honour, DO;  Location: [facility];  Service: Obstetrics;  Laterality: N/A;  . TONSILLECTOMY      No current outpatient prescriptions on file prior to visit.   No current facility-administered medications on file prior to visit.     Allergies  Allergen Reactions  . Amoxil [Amoxicillin] Anaphylaxis and Rash    Has patient had a PCN reaction causing immediate rash, facial/tongue/throat swelling, SOB or lightheadedness with hypotension: Yes Has patient had a PCN reaction causing severe rash involving mucus  membranes or skin necrosis: Yes Has patient had a PCN reaction that required hospitalization No Has patient had a PCN reaction occurring within the last 10 years: No If all of the above answers are "NO", then may proceed with Cephalosporin use.   Marland Kitchen Doxycycline Rash  . Keflex [Cephalexin] Rash  . Septra [Sulfamethoxazole-Trimethoprim] Rash  . Sulfa Antibiotics Rash    Family History  Problem Relation Age of Onset  . Thyroid disease Mother   . Cancer Father     Social History   Social History  . Marital status: Married    Spouse name: N/A  . Number of children: N/A  . Years of education: N/A   Occupational History  . Not on file.   Social History Main Topics  . Smoking status: Never Smoker  . Smokeless tobacco: Never Used  . Alcohol use No  . Drug use: No  . Sexual activity: Not Currently    Birth control/ protection: None   Other Topics Concern  . Not on file   Social History Narrative  . No narrative on file    ROS General: Denies fever, chills, night sweats, changes in weight, changes in appetite HEENT: Denies headaches, ear pain, changes in vision, rhinorrhea, sore throat CV: Denies CP, palpitations, SOB, orthopnea Pulm: Denies SOB, cough, wheezing GI: Denies abdominal pain, nausea, vomiting, diarrhea, constipation GU: Denies dysuria, hematuria, frequency, vaginal discharge Msk: Denies muscle cramps, joint pains Neuro: Denies weakness, numbness, tingling Skin: Denies rashes, bruising Psych: Denies depression, anxiety, hallucinations  BP 90/60 (BP Location: Left Arm, Patient Position: Sitting, Cuff Size: Normal)  Pulse 74   Temp 98.3 F (36.8 C) (Oral)   Ht 5' 0.5" (1.537 m)   Wt 127 lb 6.4 oz (57.8 kg)   LMP 10/01/2016   Breastfeeding? No   BMI 24.47 kg/m   Physical Exam Gen. Pleasant, well developed, well-nourished, in NAD HEENT - [place]/AT, PERRL, EOMI, conjunctive clear, no scleral icterus, no nasal drainage, pharynx without erythema or  exudate Lungs: no use of accessory muscles, no dullness to percussion, CTAB, no wheezes, rales or rhonchi Cardiovascular: RRR, No r/g/m, no peripheral edema Abdomen: BS present, soft, nontender,nondistended, no hepatosplenomegaly Musculoskeletal: No deformities, moves all four extremities, no cyanosis or clubbing, normal tone Neuro:  A&Ox3, CN II-XII intact, normal gait Skin:  Warm, dry, intact, no lesions Psych: normal affect, mood appropriate  No results found for this or any previous visit (from the past 2160 hour(s)).  Assessment/Plan: Encounter for well adult exam with abnormal findings -Anticipatory guidance given including wearing seatbelt, increasing by mouth intake of water, increasing intake of vegetables, smoke detectors in the home, increasing physical activity -Given handout on health maintenance -Will obtain Pap with OB/GYN -Labs as ordered below  - Plan: CBC with Differential/Platelet, Basic metabolic panel  Grief -PH Q9 score 1 -Given a list of area behavioral health providers for counseling -Discussed normal grief -Will re-evaluate at each visit  Screening for diabetes mellitus (DM)  - Plan: Hemoglobin A1c  Screening for cholesterol level  - Plan: Lipid panel  History of depression  - Plan: TSH, T4, free -Given handout on area behavioral health providers for counseling   Patient to bring in physical form to be completed at her convenience. Will complete this form free of charge.  Follow up in 1-2 months for grief/depression, sooner if needed.

## 2016-10-30 LAB — BASIC METABOLIC PANEL
BUN: 9 mg/dL (ref 6–23)
CO2: 24 meq/L (ref 19–32)
Calcium: 9.1 mg/dL (ref 8.4–10.5)
Chloride: 107 mEq/L (ref 96–112)
Creatinine, Ser: 0.45 mg/dL (ref 0.40–1.20)
GFR: 168.48 mL/min (ref >60.00)
GLUCOSE: 92 mg/dL (ref 70–99)
POTASSIUM: 3.9 meq/L (ref 3.5–5.1)
SODIUM: 140 meq/L (ref 135–145)

## 2016-10-30 LAB — CBC WITH DIFFERENTIAL/PLATELET
BASOS PCT: 0.3 % (ref 0.0–3.0)
Basophils Absolute: 0 10*3/uL (ref 0.0–0.1)
EOS ABS: 0 10*3/uL (ref 0.0–0.7)
EOS PCT: 0.7 % (ref 0.0–5.0)
HEMATOCRIT: 37.5 % (ref 36.0–46.0)
Hemoglobin: 12.3 g/dL (ref 12.0–15.0)
LYMPHS PCT: 27.2 % (ref 12.0–46.0)
Lymphs Abs: 1.7 10*3/uL (ref 0.7–4.0)
MCHC: 32.8 g/dL (ref 30.0–36.0)
MCV: 84.5 fl (ref 78.0–100.0)
Monocytes Absolute: 0.6 10*3/uL (ref 0.1–1.0)
Monocytes Relative: 10.2 % (ref 3.0–12.0)
Neutro Abs: 3.9 10*3/uL (ref 1.4–7.7)
Neutrophils Relative %: 61.6 % (ref 43.0–77.0)
Platelets: 262 10*3/uL (ref 150.0–400.0)
RBC: 4.44 Mil/uL (ref 3.87–5.11)
RDW: 13.3 % (ref 11.5–15.5)
WBC: 6.3 10*3/uL (ref 4.0–10.5)

## 2016-10-30 LAB — LIPID PANEL
CHOLESTEROL: 151 mg/dL (ref 0–200)
HDL: 44.8 mg/dL (ref >39.00)
LDL Cholesterol: 91 mg/dL (ref 0–99)
NONHDL: 106.42
Total CHOL/HDL Ratio: 3
Triglycerides: 76 mg/dL (ref 0.0–149.0)
VLDL: 15.2 mg/dL (ref 0.0–40.0)

## 2016-10-30 LAB — T4, FREE: Free T4: 0.7 ng/dL (ref 0.60–1.60)

## 2016-10-30 LAB — HEMOGLOBIN A1C: HEMOGLOBIN A1C: 5.1 % (ref 4.6–6.5)

## 2016-10-30 LAB — TSH: TSH: 2.18 u[IU]/mL (ref 0.35–4.50)

## 2017-06-22 ENCOUNTER — Ambulatory Visit (HOSPITAL_COMMUNITY)
Admission: EM | Admit: 2017-06-22 | Discharge: 2017-06-22 | Disposition: A | Discharge status: Home / Self Care | Payer: 59 | Attending: Family | Admitting: Family

## 2017-06-22 ENCOUNTER — Encounter (HOSPITAL_COMMUNITY): Payer: Self-pay

## 2017-06-22 ENCOUNTER — Other Ambulatory Visit: Payer: Self-pay

## 2017-06-22 DIAGNOSIS — F329 Major depressive disorder, single episode, unspecified: Secondary | ICD-10-CM

## 2017-06-22 DIAGNOSIS — F32A Depression, unspecified: Secondary | ICD-10-CM

## 2017-06-22 MED ORDER — SERTRALINE HCL 50 MG PO TABS: 50.0000 mg | ORAL_TABLET | Freq: Every day | ORAL | 0 refills | Status: DC

## 2017-06-22 NOTE — ED Triage Notes (Signed)
[facility], pt states she is experiencing thoughts of harming herself, pt was on Sertraline in June of 2017, but is not currently taking any medication

## 2017-06-22 NOTE — Discharge Instructions (Addendum)
Our hope is for gradual improvement of mood since starting medication; however this may take several weeks.   If you start to have unusual thoughts, thoughts of hurting yourself, or anyone else, please go immediately to the emergency department.   Follow up with Dr Salomon FickBanks or Dr Vickey SagesAtkins on Monday as we discussed so they can follow you on the sertraline and ensure appropriate dose as well symptoms improvement.      National Suicide Prevention Hotline - available 24 hours a day, 7 days a week.  450 599 [phone]  Major Depressive Disorder Major depressive disorder is a mental illness. It also may be called clinical depression or unipolar depression. Major depressive disorder usually causes feelings of sadness, hopelessness, or helplessness. Some people with this disorder do not feel particularly sad but lose interest in doing things they used to enjoy (anhedonia). Major depressive disorder also can cause physical symptoms. It can interfere with work, school, relationships, and other normal everyday activities. The disorder varies in severity but is longer lasting and more serious than the sadness we all feel from time to time in our lives. Major depressive disorder often is triggered by stressful life events or major life changes. Examples of these triggers include divorce, loss of your job or home, a move, and the death of a family member or close friend. Sometimes this disorder occurs for no obvious reason at all. People who have family members with major depressive disorder or bipolar disorder are at higher risk for developing this disorder, with or without life stressors. Major depressive disorder can occur at any age. It may occur just once in your life (single episode major depressive disorder). It may occur multiple times (recurrent major depressive disorder). SYMPTOMS People with major depressive disorder have either anhedonia or depressed mood on nearly a daily basis for at least 2 weeks or longer.  Symptoms of depressed mood include: Feelings of sadness (blue or down in the dumps) or emptiness. Feelings of hopelessness or helplessness. Tearfulness or episodes of crying (may be observed by others). Irritability (children and adolescents). In addition to depressed mood or anhedonia or both, people with this disorder have at least four of the following symptoms: Difficulty sleeping or sleeping too much.   Significant change (increase or decrease) in appetite or weight.   Lack of energy or motivation. Feelings of guilt and worthlessness.   Difficulty concentrating, remembering, or making decisions. Unusually slow movement (psychomotor retardation) or restlessness (as observed by others).   Recurrent wishes for death, recurrent thoughts of self-harm (suicide), or a suicide attempt. People with major depressive disorder commonly have persistent negative thoughts about themselves, other people, and the world. People with severe major depressive disorder may experience distorted beliefs or perceptions about the world (psychotic delusions). They also may see or hear things that are not real (psychotic hallucinations). DIAGNOSIS Major depressive disorder is diagnosed through an assessment by your health care provider. Your health care provider will ask about aspects of your daily life, such as mood, sleep, and appetite, to see if you have the diagnostic symptoms of major depressive disorder. Your health care provider may ask about your medical history and use of alcohol or drugs, including prescription medicines. Your health care provider also may do a physical exam and blood work. This is because certain medical conditions and the use of certain substances can cause major depressive disorder-like symptoms (secondary depression). Your health care provider also may refer you to a mental health specialist for further evaluation and treatment. TREATMENT It  is important to recognize the symptoms of major  depressive disorder and seek treatment. The following treatments can be prescribed for this disorder:   Medicine. Antidepressant medicines usually are prescribed. Antidepressant medicines are thought to correct chemical imbalances in the brain that are commonly associated with major depressive disorder. Other types of medicine may be added if the symptoms do not respond to antidepressant medicines alone or if psychotic delusions or hallucinations occur. Talk therapy. Talk therapy can be helpful in treating major depressive disorder by providing support, education, and guidance. Certain types of talk therapy also can help with negative thinking (cognitive behavioral therapy) and with relationship issues that trigger this disorder (interpersonal therapy). A mental health specialist can help determine which treatment is best for you. Most people with major depressive disorder do well with a combination of medicine and talk therapy. Treatments involving electrical stimulation of the brain can be used in situations with extremely severe symptoms or when medicine and talk therapy do not work over time. These treatments include electroconvulsive therapy, transcranial magnetic stimulation, and vagal nerve stimulation.   This information is not intended to replace advice given to you by your health care provider. Make sure you discuss any questions you have with your health care provider.   Document Released: 04/14/2012 Document Revised: 01/08/2014 Document Reviewed: 04/14/2012 Elsevier Interactive Patient Education [employer]

## 2017-06-22 NOTE — ED Provider Notes (Signed)
[facility]    CSN: [id number] Arrival date & time: 06/22/17  1643     History   Chief Complaint Chief Complaint  Patient presents with  . Anxiety  . Depression    HPI Katherine Wilkerson is a 37 y.o. female.   CC: history of depression and anxiety, over last week, has worsened. States several things have contributed to this.  Husband in [employer] and away for past week, he returns next week. She and her son have been staying with her mom who is still grieving loss of her father last year.   Son ( 2 yrs) just started daycare for more socialization since she works from home 2 days per week.  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   Reports panic attacks with shakey, heart racing.  Trouble falling asleep, decreased appetite.   [facility] and 'feels sense of purpose'. Feels like a good mother.  Always wanted a family and lives for them. Very supportive friends and family.   Loss of father 2018. Never saw grief counselor.   No suicide plan nor history of attempts . No guns in house. Has had fleeting thoughts of wandered 'if she wasn't here' but 'would never hurt ' .NO thoughts of hurting anyone else or harming her son.   While pregnant 2 years ago, had thoughts about 'what if I harmed myself.' Started on sertraline from Dr Vickey SagesAtkins- things improved and then went off medication when son was about 9 months.  H/o anxiety  Accompanied by best friend today whom she called since husband out of town.      PCP Dr Salomon FickBanks- last seen 10/2016. Normal tsh 10/2016  Past Medical History:  Diagnosis Date  . Acne   . Anemia    iron deficiency    Patient Active Problem List   Diagnosis Date Noted  . S/P cesarean section 06/13/2015  . Non-reassuring electronic fetal monitoring tracing 06/12/2015  . History of irritable bowel syndrome 12/16/2012  . Menorrhagia 03/20/2012  . iron deficiency anemia 03/20/2012     Past Surgical History:  Procedure Laterality Date  . CESAREAN SECTION N/A 06/13/2015   Procedure: CESAREAN SECTION;  Surgeon: Mitchel HonourMegan Morris, DO;  Location: [facility];  Service: Obstetrics;  Laterality: N/A;  . TONSILLECTOMY      OB History    Gravida  1   Para  1   Term  1   Preterm      AB      Living  1     SAB      TAB      Ectopic      Multiple  0   Live Births  1            Home Medications    Prior to Admission medications   Medication Sig Start Date End Date Taking? Authorizing Provider  Probiotic Product (PROBIOTIC DAILY PO) Take by mouth.    [provider]  sertraline (ZOLOFT) 50 MG tablet Take 1 tablet (50 mg total) by mouth at bedtime. 06/22/17   Allegra GranaArnett, Treyveon Mochizuki G, FNP    Family History Family History  Problem Relation Age of Onset  . Thyroid disease Mother   . Cancer Father     Social History Social History   Tobacco Use  . Smoking status: Never Smoker  . Smokeless tobacco: Never Used  Substance Use Topics  . Alcohol use: No  . Drug  use: No     Allergies   Amoxil [amoxicillin]; Doxycycline; Keflex [cephalexin]; Septra [sulfamethoxazole-trimethoprim]; and Sulfa antibiotics   Review of Systems Review of Systems  Constitutional: Negative for chills and fever.  Respiratory: Negative for cough.   Cardiovascular: Negative for chest pain and palpitations.  Gastrointestinal: Negative for nausea and vomiting.  Psychiatric/Behavioral: Positive for sleep disturbance. Negative for suicidal ideas. The patient is nervous/anxious.      Physical Exam Triage Vital Signs ED Triage Vitals  Enc Vitals Group     BP      Pulse      Resp      Temp      Temp src      SpO2      Weight      Height      Head Circumference      Peak Flow      Pain Score      Pain Loc      Pain Edu?      Excl. in GC?    No data found.  Updated Vital Signs BP 116/71 (BP Location: Left Arm)   Pulse 75   Temp 98.1 F (36.7 C)  (Oral)   Resp 16   LMP 06/01/2017 (Exact Date)   SpO2 98%   Visual Acuity Right Eye Distance:   Left Eye Distance:   Bilateral Distance:    Right Eye Near:   Left Eye Near:    Bilateral Near:     Physical Exam  Constitutional: She appears well-developed and well-nourished.  Eyes: Conjunctivae are normal.  Cardiovascular: Normal rate, regular rhythm, normal heart sounds and normal pulses.  Pulmonary/Chest: Effort normal and breath sounds normal. She has no wheezes. She has no rhonchi. She has no rales.  Neurological: She is alert.  Skin: Skin is warm and dry.  Psychiatric: She has a normal mood and affect. Her speech is normal and behavior is normal. Thought content normal.  Vitals reviewed.    UC Treatments / Results  Labs (all labs ordered are listed, but only abnormal results are displayed) Labs Reviewed - No data to display  EKG None  Radiology No results found.  Procedures Procedures (including critical care time)  Medications Ordered in UC Medications - No data to display  Initial Impression / Assessment and Plan / UC Course  I have reviewed the triage vital signs and the nursing notes.  Pertinent labs & imaging results that were available during my care of the patient were reviewed by me and considered in my medical decision making (see chart for details).     Final Clinical Impressions(s) / UC Diagnoses   Final diagnoses:  Depression, unspecified depression type  Long and candid discussion with patient this evening.  She is adamant that she does not have a suicide plan or  thoughts of hurting herself or anyone else.  She has had fleeting thoughts and "wandered" what it may be like if she was not here however she has a strong sense  purpose, faith, supportive family, friends.  Her friend who accompanies her today agrees with this.  I do not believe that patient is an immediate danger to herself and patient assured me of this.   In this context patient,   As she established with PCP and also her GYN, we jointly agreed we will go ahead and startsertraline which she had done well on with in the past.  I have given her 30-day supply this medication and she will call her  GYN or PCP on Monday of next week to ensure appointment.  I given her a lbehavioral health contact for which patient can pursue counseling.  Return precautions given.  I spent 25 min face to face w/ pt. Of which greater than 50% of time was spent Discussing history of anxiety, depression and treatment modalities.    Discharge Instructions     Our hope is for gradual improvement of mood since starting medication; however this may take several weeks.   If you start to have unusual thoughts, thoughts of hurting yourself, or anyone else, please go immediately to the emergency department.   Follow up with Dr Salomon Fick or Dr Vickey Sages on Monday as we discussed so they can follow you on the sertraline and ensure appropriate dose as well symptoms improvement.      National Suicide Prevention Hotline - available 24 hours a day, 7 days a week.  (601)826-7218  Major Depressive Disorder Major depressive disorder is a mental illness. It also may be called clinical depression or unipolar depression. Major depressive disorder usually causes feelings of sadness, hopelessness, or helplessness. Some people with this disorder do not feel particularly sad but lose interest in doing things they used to enjoy (anhedonia). Major depressive disorder also can cause physical symptoms. It can interfere with work, school, relationships, and other normal everyday activities. The disorder varies in severity but is longer lasting and more serious than the sadness we all feel from time to time in our lives. Major depressive disorder often is triggered by stressful life events or major life changes. Examples of these triggers include divorce, loss of your job or home, a move, and the death of a family member or close  friend. Sometimes this disorder occurs for no obvious reason at all. People who have family members with major depressive disorder or bipolar disorder are at higher risk for developing this disorder, with or without life stressors. Major depressive disorder can occur at any age. It may occur just once in your life (single episode major depressive disorder). It may occur multiple times (recurrent major depressive disorder). SYMPTOMS People with major depressive disorder have either anhedonia or depressed mood on nearly a daily basis for at least 2 weeks or longer. Symptoms of depressed mood include:  Feelings of sadness (blue or down in the dumps) or emptiness.  Feelings of hopelessness or helplessness.  Tearfulness or episodes of crying (may be observed by others).  Irritability (children and adolescents). In addition to depressed mood or anhedonia or both, people with this disorder have at least four of the following symptoms:  Difficulty sleeping or sleeping too much.    Significant change (increase or decrease) in appetite or weight.    Lack of energy or motivation.  Feelings of guilt and worthlessness.    Difficulty concentrating, remembering, or making decisions.  Unusually slow movement (psychomotor retardation) or restlessness (as observed by others).    Recurrent wishes for death, recurrent thoughts of self-harm (suicide), or a suicide attempt. People with major depressive disorder commonly have persistent negative thoughts about themselves, other people, and the world. People with severe major depressive disorder may experience distorted beliefs or perceptions about the world (psychotic delusions). They also may see or hear things that are not real (psychotic hallucinations). DIAGNOSIS Major depressive disorder is diagnosed through an assessment by your health care provider. Your health care provider will ask about aspects of your daily life, such as mood, sleep, and appetite, to  see if you have the diagnostic  symptoms of major depressive disorder. Your health care provider may ask about your medical history and use of alcohol or drugs, including prescription medicines. Your health care provider also may do a physical exam and blood work. This is because certain medical conditions and the use of certain substances can cause major depressive disorder-like symptoms (secondary depression). Your health care provider also may refer you to a mental health specialist for further evaluation and treatment. TREATMENT It is important to recognize the symptoms of major depressive disorder and seek treatment. The following treatments can be prescribed for this disorder:    Medicine. Antidepressant medicines usually are prescribed. Antidepressant medicines are thought to correct chemical imbalances in the brain that are commonly associated with major depressive disorder. Other types of medicine may be added if the symptoms do not respond to antidepressant medicines alone or if psychotic delusions or hallucinations occur.  Talk therapy. Talk therapy can be helpful in treating major depressive disorder by providing support, education, and guidance. Certain types of talk therapy also can help with negative thinking (cognitive behavioral therapy) and with relationship issues that trigger this disorder (interpersonal therapy). A mental health specialist can help determine which treatment is best for you. Most people with major depressive disorder do well with a combination of medicine and talk therapy. Treatments involving electrical stimulation of the brain can be used in situations with extremely severe symptoms or when medicine and talk therapy do not work over time. These treatments include electroconvulsive therapy, transcranial magnetic stimulation, and vagal nerve stimulation.   This information is not intended to replace advice given to you by your health care provider. Make sure you discuss  any questions you have with your health care provider.   Document Released: 04/14/2012 Document Revised: 01/08/2014 Document Reviewed: 04/14/2012 Elsevier Interactive Patient Education 2016 [employer].       ED Prescriptions    Medication Sig Dispense Auth. Provider   sertraline (ZOLOFT) 50 MG tablet Take 1 tablet (50 mg total) by mouth at bedtime. 30 tablet Allegra Grana, FNP     Controlled Substance Prescriptions [place] Controlled Substance Registry consulted? Not Applicable   Allegra Grana, FNP 06/22/17 1807

## 2017-07-19 ENCOUNTER — Other Ambulatory Visit: Payer: Self-pay | Admitting: Family

## 2017-09-11 DIAGNOSIS — Z01419 Encounter for gynecological examination (general) (routine) without abnormal findings: Secondary | ICD-10-CM | POA: Diagnosis not present

## 2017-09-11 DIAGNOSIS — Z6823 Body mass index (BMI) 23.0-23.9, adult: Secondary | ICD-10-CM | POA: Diagnosis not present

## 2018-02-11 ENCOUNTER — Encounter: Payer: Self-pay | Admitting: Family Medicine

## 2018-02-11 ENCOUNTER — Ambulatory Visit: Payer: 59 | Admitting: Family Medicine

## 2018-02-11 VITALS — BP 108/70 | HR 86 | Temp 98.4°F | Resp 12 | Ht 60.5 in | Wt 132.0 lb

## 2018-02-11 DIAGNOSIS — R0981 Nasal congestion: Secondary | ICD-10-CM | POA: Diagnosis not present

## 2018-02-11 DIAGNOSIS — J101 Influenza due to other identified influenza virus with other respiratory manifestations: Secondary | ICD-10-CM | POA: Diagnosis not present

## 2018-02-11 DIAGNOSIS — R51 Headache: Secondary | ICD-10-CM

## 2018-02-11 DIAGNOSIS — R519 Headache, unspecified: Secondary | ICD-10-CM

## 2018-02-11 LAB — POCT INFLUENZA A/B
INFLUENZA A, POC: NEGATIVE
INFLUENZA B, POC: POSITIVE — AB

## 2018-02-11 MED ORDER — FLUTICASONE PROPIONATE 50 MCG/ACT NA SUSP: 1.0000 | Freq: Two times a day (BID) | NASAL | 1 refills | Status: DC

## 2018-02-11 MED ORDER — TRAMADOL HCL 50 MG PO TABS: 50.0000 mg | ORAL_TABLET | Freq: Two times a day (BID) | ORAL | 0 refills | Status: AC | PRN

## 2018-02-11 NOTE — Patient Instructions (Signed)
A few things to remember from today's visit:   Headache, unspecified headache type - Plan: POC Influenza A/B, traMADol (ULTRAM) 50 MG tablet  Influenza B - Plan: POC Influenza A/B   Influenza, Adult Influenza is also called "the flu." It is an infection in the lungs, nose, and throat (respiratory tract). It is caused by a virus. The flu causes symptoms that are similar to symptoms of a cold. It also causes a high fever and body aches. The flu spreads easily from person to person (is contagious). Getting a flu shot (influenza vaccination) every year is the best way to prevent the flu. What are the causes? This condition is caused by the influenza virus. You can get the virus by:  Breathing in droplets that are in the air from the cough or sneeze of a person who has the virus.  Touching something that has the virus on it (is contaminated) and then touching your mouth, nose, or eyes. What increases the risk? Certain things may make you more likely to get the flu. These include:  Not washing your hands often.  Having close contact with many people during cold and flu season.  Touching your mouth, eyes, or nose without first washing your hands.  Not getting a flu shot every year. You may have a higher risk for the flu, along with serious problems such as a lung infection (pneumonia), if you:  Are older than 65.  Are pregnant.  Have a weakened disease-fighting system (immune system) because of a disease or taking certain medicines.  Have a long-term (chronic) illness, such as: ? Heart, kidney, or lung disease. ? Diabetes. ? Asthma.  Have a liver disorder.  Are very overweight (morbidly obese).  Have anemia. This is a condition that affects your red blood cells. What are the signs or symptoms? Symptoms usually begin suddenly and last 4-14 days. They may include:  Fever and chills.  Headaches, body aches, or muscle aches.  Sore throat.  Cough.  Runny or stuffy  (congested) nose.  Chest discomfort.  Not wanting to eat as much as normal (poor appetite).  Weakness or feeling tired (fatigue).  Dizziness.  Feeling sick to your stomach (nauseous) or throwing up (vomiting). How is this treated? If the flu is found early, you can be treated with medicine that can help reduce how bad the illness is and how long it lasts (antiviral medicine). This may be given by mouth (orally) or through an IV tube. Taking care of yourself at home can help your symptoms get better. Your doctor may suggest:  Taking over-the-counter medicines.  Drinking plenty of fluids. The flu often goes away on its own. If you have very bad symptoms or other problems, you may be treated in a hospital. Follow these instructions at home:     Activity  Rest as needed. Get plenty of sleep.  Stay home from work or school as told by your doctor. ? Do not leave home until you do not have a fever for 24 hours without taking medicine. ? Leave home only to visit your doctor. Eating and drinking  Take an ORS (oral rehydration solution). This is a drink that is sold at pharmacies and stores.  Drink enough fluid to keep your pee (urine) pale yellow.  Drink clear fluids in small amounts as you are able. Clear fluids include: ? Water. ? Ice chips. ? Fruit juice that has water added (diluted fruit juice). ? Low-calorie sports drinks.  Eat bland, easy-to-digest foods in  small amounts as you are able. These foods include: ? Bananas. ? Applesauce. ? Rice. ? Lean meats. ? Toast. ? Crackers.  Do not eat or drink: ? Fluids that have a lot of sugar or caffeine. ? Alcohol. ? Spicy or fatty foods. General instructions  Take over-the-counter and prescription medicines only as told by your doctor.  Use a cool mist humidifier to add moisture to the air in your home. This can make it easier for you to breathe.  Cover your mouth and nose when you cough or sneeze.  Wash your hands  with soap and water often, especially after you cough or sneeze. If you cannot use soap and water, use alcohol-based hand sanitizer.  Keep all follow-up visits as told by your doctor. This is important. How is this prevented?   Get a flu shot every year. You may get the flu shot in late summer, fall, or winter. Ask your doctor when you should get your flu shot.  Avoid contact with people who are sick during fall and winter (cold and flu season). Contact a doctor if:  You get new symptoms.  You have: ? Chest pain. ? Watery poop (diarrhea). ? A fever.  Your cough gets worse.  You start to have more mucus.  You feel sick to your stomach.  You throw up. Get help right away if you:  Have shortness of breath.  Have trouble breathing.  Have skin or nails that turn a bluish color.  Have very bad pain or stiffness in your neck.  Get a sudden headache.  Get sudden pain in your face or ear.  Cannot eat or drink without throwing up. Summary  Influenza ("the flu") is an infection in the lungs, nose, and throat. It is caused by a virus.  Take over-the-counter and prescription medicines only as told by your doctor.  Getting a flu shot every year is the best way to avoid getting the flu. This information is not intended to replace advice given to you by your health care provider. Make sure you discuss any questions you have with your health care provider. Document Released: 09/27/2007 Document Revised: 06/05/2017 Document Reviewed: 06/05/2017 Elsevier Interactive Patient Education  2019 [employer]  Please be sure medication list is accurate. If a new problem present, please set up appointment sooner than planned today.

## 2018-02-11 NOTE — Progress Notes (Signed)
ACUTE VISIT  HPI:  Chief Complaint  Patient presents with  . Cough    with mucus, started last Thursday  . Nasal Congestion  . Headache    last night, very bad    Ms.Shandelle Lucillie GarfinkelGwaltney Randleman is a 38 y.o.female here today complaining of 5 days of respiratory symptoms. Mild productive cough,denies hemoptysis. Fatigue, body aches, and chills. She has not noted fever.   URI   This is a new problem. The current episode started in the past 7 days. The problem has been unchanged. There has been no fever. Associated symptoms include congestion, coughing, ear pain, headaches, a plugged ear sensation, rhinorrhea and a sore throat. Pertinent negatives include no abdominal pain, chest pain, diarrhea, nausea, neck pain, rash, sneezing, swollen glands, vomiting or wheezing. She has tried acetaminophen for the symptoms. The treatment provided mild relief.   Parietal and frontal stubbing like headache,8-9/10.  No associated neck stiffness, visual changes, nausea, vomiting, MS changes,or focal weakness. Headache is aggravated by bending down and going up and down stairs. Alleviated by rest. It was worse last night.  She has history of headaches, attributed to being working on the computer all day at work.It is also exacerbated by weather changes.  No Hx of recent travel. Some coworkers have been sick. No known insect bite.  Hx of allergies: Seasonal allergies for the past 5 years.  OTC medications for this problem: Tylenol    Review of Systems  Constitutional: Positive for activity change, chills and fatigue. Negative for appetite change and fever.  HENT: Positive for congestion, ear pain, postnasal drip, rhinorrhea and sore throat. Negative for facial swelling, hearing loss, mouth sores, nosebleeds, sinus pressure, sneezing, trouble swallowing and voice change.   Eyes: Negative for discharge, redness and visual disturbance.  Respiratory: Positive for cough. Negative for  shortness of breath and wheezing.   Cardiovascular: Negative for chest pain.  Gastrointestinal: Negative for abdominal pain, diarrhea, nausea and vomiting.  Musculoskeletal: Positive for myalgias. Negative for joint swelling and neck pain.  Skin: Negative for rash.  Allergic/Immunologic: Positive for environmental allergies.  Neurological: Positive for headaches. Negative for weakness.  Hematological: Negative for adenopathy. Does not bruise/bleed easily.      Current Outpatient Medications on File Prior to Visit  Medication Sig Dispense Refill  . Probiotic Product (PROBIOTIC DAILY PO) Take by mouth.    . sertraline (ZOLOFT) 100 MG tablet      No current facility-administered medications on file prior to visit.      Past Medical History:  Diagnosis Date  . Acne   . Anemia    iron deficiency   Allergies  Allergen Reactions  . Amoxil [Amoxicillin] Anaphylaxis and Rash    Has patient had a PCN reaction causing immediate rash, facial/tongue/throat swelling, SOB or lightheadedness with hypotension: Yes Has patient had a PCN reaction causing severe rash involving mucus membranes or skin necrosis: Yes Has patient had a PCN reaction that required hospitalization No Has patient had a PCN reaction occurring within the last 10 years: No If all of the above answers are "NO", then may proceed with Cephalosporin use.   Marland Kitchen. Doxycycline Rash  . Keflex [Cephalexin] Rash  . Septra [Sulfamethoxazole-Trimethoprim] Rash  . Sulfa Antibiotics Rash    Social History   Socioeconomic History  . Marital status: Married    Spouse name: Not on file  . Number of children: Not on file  . Years of education: Not on file  . Highest  education level: Not on file  Occupational History  . Not on file  Social Needs  . Financial resource strain: Not on file  . Food insecurity:    Worry: Not on file    Inability: Not on file  . Transportation needs:    Medical: Not on file    Non-medical: Not on  file  Tobacco Use  . Smoking status: Never Smoker  . Smokeless tobacco: Never Used  Substance and Sexual Activity  . Alcohol use: No  . Drug use: No  . Sexual activity: Not Currently    Birth control/protection: None  Lifestyle  . Physical activity:    Days per week: Not on file    Minutes per session: Not on file  . Stress: Not on file  Relationships  . Social connections:    Talks on phone: Not on file    Gets together: Not on file    Attends religious service: Not on file    Active member of club or organization: Not on file    Attends meetings of clubs or organizations: Not on file    Relationship status: Not on file  Other Topics Concern  . Not on file  Social History Narrative  . Not on file    Vitals:   02/11/18 1520  BP: 108/70  Pulse: 86  Resp: 12  Temp: 98.4 F (36.9 C)  SpO2: 98%   Body mass index is 25.36 kg/m.   Physical Exam  Nursing note and vitals reviewed. Constitutional: She is oriented to person, place, and time. She appears well-developed. She does not appear ill. No distress.  HENT:  Head: Atraumatic.  Right Ear: External ear and ear canal normal. Tympanic membrane is not erythematous and not bulging. A middle ear effusion (mild) is present.  Left Ear: External ear and ear canal normal. Tympanic membrane is not erythematous and not bulging. A middle ear effusion (mild) is present.  Nose: Rhinorrhea present. Right sinus exhibits no maxillary sinus tenderness. Left sinus exhibits no maxillary sinus tenderness.  Mouth/Throat: Oropharynx is clear and moist and mucous membranes are normal.  Mild tenderness upon pressing frontal sinuses. Also tenderness upon parietal scalp palpation. No erythema or scalp lesions appreciated.  Normal sinus transillumination. Mildly hypertrophic turbinates.  Eyes: Conjunctivae and EOM are normal.  Neck: Neck supple. No muscular tenderness present.  Cardiovascular: Normal rate and regular rhythm.  No murmur  heard. Respiratory: Effort normal and breath sounds normal. No respiratory distress.  Lymphadenopathy:       Head (right side): No submandibular adenopathy present.       Head (left side): No submandibular adenopathy present.    She has no cervical adenopathy.  Neurological: She is alert and oriented to person, place, and time. She has normal strength. No cranial nerve deficit. Gait normal.  Skin: Skin is warm. No rash noted. No erythema.  Psychiatric: Her mood appears anxious.  Well groomed, good eye contact.      ASSESSMENT AND PLAN:  Ms. Baxter HireKristen was seen today for cough, nasal congestion and headache.  Diagnoses and all orders for this visit:  Influenza B Educated about diagnosis. Because she has been more than 48 to 72 hours, antiviral medication was not recommended. Plenty of p.o. fluids and rest. She has a 37-year-old son, recommended calling his pediatrician for prophylactic Tamiflu.  -     POC Influenza A/B  Headache, unspecified headache type Most likely related to viral illness. Today neurologic examination is normal. Recommend OTC acetaminophen 500  mg 3-4 times per day. We discussed some side effects of tramadol, recommend 50 mg twice daily as needed.  She was clearly instructed about warning signs.  -     POC Influenza A/B -     traMADol (ULTRAM) 50 MG tablet; Take 1 tablet (50 mg total) by mouth every 12 (twelve) hours as needed for up to 5 days.  Nasal sinus congestion Flonase intranasal spray daily as needed may help. We discussed the option of short course of prednisone, we decided to hold on it for now but could be considered if symptoms are persistent.  -     fluticasone (FLONASE) 50 MCG/ACT nasal spray; Place 1 spray into both nostrils 2 (two) times daily.    -Ms. Lars Masson Davies was advised to seek attention immediately if symptoms worsen or to follow if they persist or new concerns arise.    Leshay Desaulniers G. [place], MD  [facility]. [place] office.

## 2018-03-05 ENCOUNTER — Other Ambulatory Visit: Payer: Self-pay | Admitting: Family Medicine

## 2018-03-05 DIAGNOSIS — R0981 Nasal congestion: Secondary | ICD-10-CM

## 2018-06-07 ENCOUNTER — Other Ambulatory Visit: Payer: Self-pay | Admitting: *Deleted

## 2018-06-07 DIAGNOSIS — Z20822 Contact with and (suspected) exposure to covid-19: Secondary | ICD-10-CM

## 2018-06-07 NOTE — Progress Notes (Signed)
[id number]  

## 2018-06-09 LAB — NOVEL CORONAVIRUS, NAA: SARS-CoV-2, NAA: NOT DETECTED

## 2018-09-23 LAB — OB RESULTS CONSOLE ANTIBODY SCREEN: Antibody Screen: NEGATIVE

## 2018-09-23 LAB — OB RESULTS CONSOLE ABO/RH: RH Type: POSITIVE

## 2018-09-23 LAB — OB RESULTS CONSOLE HIV ANTIBODY (ROUTINE TESTING): HIV: NONREACTIVE

## 2018-09-23 LAB — OB RESULTS CONSOLE HEPATITIS B SURFACE ANTIGEN: Hepatitis B Surface Ag: NEGATIVE

## 2018-09-23 LAB — OB RESULTS CONSOLE RUBELLA ANTIBODY, IGM: Rubella: IMMUNE

## 2018-09-23 LAB — OB RESULTS CONSOLE RPR: RPR: NONREACTIVE

## 2018-09-23 LAB — OB RESULTS CONSOLE GC/CHLAMYDIA
Chlamydia: NEGATIVE
Gonorrhea: NEGATIVE

## 2019-01-13 ENCOUNTER — Ambulatory Visit: Payer: 59 | Attending: Internal Medicine

## 2019-01-13 DIAGNOSIS — Z20822 Contact with and (suspected) exposure to covid-19: Secondary | ICD-10-CM

## 2019-01-15 LAB — NOVEL CORONAVIRUS, NAA: SARS-CoV-2, NAA: NOT DETECTED

## 2019-02-24 ENCOUNTER — Ambulatory Visit: Payer: 59 | Attending: Internal Medicine

## 2019-02-24 DIAGNOSIS — Z20822 Contact with and (suspected) exposure to covid-19: Secondary | ICD-10-CM

## 2019-02-25 ENCOUNTER — Ambulatory Visit: Payer: 59

## 2019-02-25 LAB — NOVEL CORONAVIRUS, NAA: SARS-CoV-2, NAA: NOT DETECTED

## 2019-03-09 ENCOUNTER — Inpatient Hospital Stay (HOSPITAL_COMMUNITY)
Admission: AD | Admit: 2019-03-09 | Discharge: 2019-03-09 | Disposition: A | Discharge status: Home / Self Care | Payer: Commercial Managed Care - PPO | Attending: Obstetrics & Gynecology | Admitting: Obstetrics & Gynecology

## 2019-03-09 ENCOUNTER — Encounter (HOSPITAL_COMMUNITY): Payer: Self-pay | Admitting: *Deleted

## 2019-03-09 DIAGNOSIS — Z882 Allergy status to sulfonamides status: Secondary | ICD-10-CM | POA: Insufficient documentation

## 2019-03-09 DIAGNOSIS — O09523 Supervision of elderly multigravida, third trimester: Secondary | ICD-10-CM | POA: Diagnosis not present

## 2019-03-09 DIAGNOSIS — Z880 Allergy status to penicillin: Secondary | ICD-10-CM | POA: Insufficient documentation

## 2019-03-09 DIAGNOSIS — O26899 Other specified pregnancy related conditions, unspecified trimester: Secondary | ICD-10-CM

## 2019-03-09 DIAGNOSIS — Z3A31 31 weeks gestation of pregnancy: Secondary | ICD-10-CM | POA: Insufficient documentation

## 2019-03-09 DIAGNOSIS — R102 Pelvic and perineal pain: Secondary | ICD-10-CM

## 2019-03-09 DIAGNOSIS — Z881 Allergy status to other antibiotic agents status: Secondary | ICD-10-CM | POA: Diagnosis not present

## 2019-03-09 DIAGNOSIS — O26893 Other specified pregnancy related conditions, third trimester: Secondary | ICD-10-CM | POA: Insufficient documentation

## 2019-03-09 DIAGNOSIS — Z3689 Encounter for other specified antenatal screening: Secondary | ICD-10-CM

## 2019-03-09 HISTORY — DX: Gestational diabetes mellitus in pregnancy, unspecified control: O24.419

## 2019-03-09 LAB — URINALYSIS, ROUTINE W REFLEX MICROSCOPIC
Bilirubin Urine: NEGATIVE
Glucose, UA: NEGATIVE mg/dL
Hgb urine dipstick: NEGATIVE
Ketones, ur: 20 mg/dL — AB
Leukocytes,Ua: NEGATIVE
Nitrite: NEGATIVE
Protein, ur: NEGATIVE mg/dL
Specific Gravity, Urine: 1.012 (ref 1.005–1.030)
pH: 6 (ref 5.0–8.0)

## 2019-03-09 LAB — WET PREP, GENITAL
Clue Cells Wet Prep HPF POC: NONE SEEN
Sperm: NONE SEEN
Trich, Wet Prep: NONE SEEN
Yeast Wet Prep HPF POC: NONE SEEN

## 2019-03-09 MED ORDER — COMFORT FIT MATERNITY SUPP SM MISC: 1.0000 [IU] | Freq: Every day | 0 refills | Status: AC | PRN

## 2019-03-09 MED ORDER — ACETAMINOPHEN 500 MG PO TABS
1000.0000 mg | ORAL_TABLET | Freq: Once | ORAL | Status: AC
  Administered 2019-03-09: 1000 mg via ORAL
  Filled 2019-03-09: qty 2

## 2019-03-09 NOTE — MAU Provider Note (Signed)
History     CSN: [id number]  Arrival date and time: 03/09/19 1552   First Provider Initiated Contact with Patient 03/09/19 1655      Chief Complaint  Patient presents with  . Contractions   Katherine Wilkerson is a 39 y.o. G2P1001 at [redacted]w[redacted]d who presents to MAU for PTL evaluation after pelvic pain and pressure began last night. Pt reports it lasted for about 30 minutes last night and then went away and she was able to sleep through the night. Pt reports as she was getting her son ready this morning the pain and pressure returned and did not go away quickly. Pt reports the pain comes and goes, but reports it will be present for a few minutes to 20 minutes at a time and reports that her stomach feels "sore" and she was experiencing a constant pressure and was told the baby is sitting low in the pelvis. Pt denies intermittent tightening of abdomen. Pt denies the sensation of wanting to push something out. Pt also reports intermittent menstrual-like cramps.  Patient rates pain as 7/10. Pt reports she does not have a pregnancy support belt at home.  Pt denies any issues with urination. Pt reports last BM this morning which was normal.  Pt was seen in the office by Dr. Lynnette Caffey, who reports an fFN was not performed before a cervical check in the office around 11AM this morning that showed the cervix to be 1/50/-2.  Pt denies ctx, change in vaginal discharge amount/color/consistency, VB, new onset backache. Pt denies chest pain and SOB.  Pt denies constipation, diarrhea, or urinary problems. Pt denies fever, chills, fatigue, sweating or changes in appetite. Pt denies dizziness, light-headedness, weakness.  Pt denies VB, LOF and reports good FM.  Current pregnancy problems? hx C/S x1, GDM Blood Type? O Positive Allergies? AMOX, Doxy, Keflex, Sulfa Current medications? Sertraline, PNV, Probiotic Current PNC & next appt? [facility], 03/18/2019   OB History    Gravida  2    Para  1   Term  1   Preterm      AB      Living  1     SAB      TAB      Ectopic      Multiple  0   Live Births  1           Past Medical History:  Diagnosis Date  . Acne   . Anemia    iron deficiency  . Gestational diabetes     Past Surgical History:  Procedure Laterality Date  . CESAREAN SECTION N/A 06/13/2015   Procedure: CESAREAN SECTION;  Surgeon: Linda Hedges, DO;  Location: [place];  Service: Obstetrics;  Laterality: N/A;  . TONSILLECTOMY      Family History  Problem Relation Age of Onset  . Thyroid disease Mother   . Cancer Father     Social History   Tobacco Use  . Smoking status: Never Smoker  . Smokeless tobacco: Never Used  Substance Use Topics  . Alcohol use: No  . Drug use: No    Allergies:  Allergies  Allergen Reactions  . Amoxil [Amoxicillin] Anaphylaxis and Rash    Has patient had a PCN reaction causing immediate rash, facial/tongue/throat swelling, SOB or lightheadedness with hypotension: Yes Has patient had a PCN reaction causing severe rash involving mucus membranes or skin necrosis: Yes Has patient had a PCN reaction that required hospitalization No Has patient had a PCN  reaction occurring within the last 10 years: No If all of the above answers are "NO", then may proceed with Cephalosporin use.   Marland Kitchen Doxycycline Rash  . Keflex [Cephalexin] Rash  . Septra [Sulfamethoxazole-Trimethoprim] Rash  . Sulfa Antibiotics Rash    Medications Prior to Admission  Medication Sig Dispense Refill Last Dose  . Prenatal Vit-Fe Fumarate-FA (PRENATAL MULTIVITAMIN) TABS tablet Take 1 tablet by mouth daily at 12 noon.   03/09/2019 at Unknown time  . Probiotic Product (PROBIOTIC DAILY PO) Take by mouth.   03/09/2019 at Unknown time  . sertraline (ZOLOFT) 100 MG tablet    03/09/2019 at Unknown time  . fluticasone (FLONASE) 50 MCG/ACT nasal spray PLACE 1 SPRAY INTO BOTH NOSTRILS 2 (TWO) TIMES DAILY. 16 g 1     Review of Systems   Constitutional: Negative for chills, diaphoresis, fatigue and fever.  Eyes: Negative for visual disturbance.  Respiratory: Negative for shortness of breath.   Cardiovascular: Negative for chest pain.  Gastrointestinal: Negative for abdominal pain, constipation, diarrhea, nausea and vomiting.  Genitourinary: Positive for pelvic pain. Negative for dysuria, flank pain, frequency, urgency, vaginal bleeding and vaginal discharge.  Neurological: Negative for dizziness, weakness, light-headedness and headaches.   Physical Exam   Blood pressure 108/64, pulse 88, temperature 97.9 F (36.6 C), resp. rate 18.  Patient Vitals for the past 24 hrs:  BP Temp Pulse Resp  03/09/19 1740 108/64 97.9 F (36.6 C) 88 18   Physical Exam  Constitutional: She is oriented to person, place, and time. She appears well-developed and well-nourished. No distress.  HENT:  Head: Normocephalic and atraumatic.  Respiratory: Effort normal.  GI: Soft. She exhibits no distension and no mass. There is no abdominal tenderness. There is no rebound and no guarding.  Genitourinary:    Genitourinary Comments: CE: 2/50/ballotable   Neurological: She is alert and oriented to person, place, and time.  Skin: Skin is warm and dry. She is not diaphoretic.  Psychiatric: She has a normal mood and affect. Her behavior is normal. Judgment and thought content normal.   Results for orders placed or performed during the hospital encounter of 03/09/19 (from the past 24 hour(s))  Wet prep, genital     Status: Abnormal   Collection Time: 03/09/19  5:35 PM  Result Value Ref Range   Yeast Wet Prep HPF POC NONE SEEN NONE SEEN   Trich, Wet Prep NONE SEEN NONE SEEN   Clue Cells Wet Prep HPF POC NONE SEEN NONE SEEN   WBC, Wet Prep HPF POC MODERATE (A) NONE SEEN   Sperm NONE SEEN   Urinalysis, Routine w reflex microscopic     Status: Abnormal   Collection Time: 03/09/19  6:24 PM  Result Value Ref Range   Color, Urine YELLOW YELLOW    APPearance HAZY (A) CLEAR   Specific Gravity, Urine 1.012 1.005 - 1.030   pH 6.0 5.0 - 8.0   Glucose, UA NEGATIVE NEGATIVE mg/dL   Hgb urine dipstick NEGATIVE NEGATIVE   Bilirubin Urine NEGATIVE NEGATIVE   Ketones, ur 20 (A) NEGATIVE mg/dL   Protein, ur NEGATIVE NEGATIVE mg/dL   Nitrite NEGATIVE NEGATIVE   Leukocytes,Ua NEGATIVE NEGATIVE   MAU Course  Procedures  MDM -r/o PTL -CE 1/50/-2 in office in AM -CE in MAU 2/50/ballotable about 5PM -fFN unable to be completed d/t prior cervical exam in office this morning -UA: hazy/20ketones, otherwise WNL -WetPrep: WNL -GC/CT collected -EFM: reactive       -baseline: 130/120       -  variability: moderate       -accels: present, 15x15       -decels: none       -TOCO: few ctx, irritability -repeat CE: unchanged, confirmed by RN -pt given Tylenol and heating packs for pain, pt reports pain now 4/10 and continuing to improve, pt declines offer for Flexeril -pt discharged to home in stable condition  Orders Placed This Encounter  Procedures  . Wet prep, genital    Standing Status:   Standing    Number of Occurrences:   1  . Urinalysis, Routine w reflex microscopic    Standing Status:   Standing    Number of Occurrences:   1  . Discharge patient    Order Specific Question:   Discharge disposition    Answer:   01-Home or Self Care [1]    Order Specific Question:   Discharge patient date    Answer:   03/09/2019   Meds ordered this encounter  Medications  . acetaminophen (TYLENOL) tablet 1,000 mg  . Elastic Bandages & Supports (COMFORT FIT MATERNITY SUPP SM) MISC    Sig: 1 Units by Does not apply route daily as needed.    Dispense:  1 each    Refill:  0    Order Specific Question:   Supervising Provider    Answer:   Reva Bores [[id number]]   Assessment and Plan   1. Pelvic pain in pregnancy   2. [redacted] weeks gestation of pregnancy   3. NST (non-stress test) reactive    Allergies as of 03/09/2019      Reactions   Amoxil  [amoxicillin] Anaphylaxis, Rash   Has patient had a PCN reaction causing immediate rash, facial/tongue/throat swelling, SOB or lightheadedness with hypotension: Yes Has patient had a PCN reaction causing severe rash involving mucus membranes or skin necrosis: Yes Has patient had a PCN reaction that required hospitalization No Has patient had a PCN reaction occurring within the last 10 years: No If all of the above answers are "NO", then may proceed with Cephalosporin use.   Doxycycline Rash   Keflex [cephalexin] Rash   Septra [sulfamethoxazole-trimethoprim] Rash   Sulfa Antibiotics Rash      Medication List    TAKE these medications   Comfort Fit Maternity Supp Sm Misc 1 Units by Does not apply route daily as needed.   fluticasone 50 MCG/ACT nasal spray Commonly known as: FLONASE PLACE 1 SPRAY INTO BOTH NOSTRILS 2 (TWO) TIMES DAILY.   prenatal multivitamin Tabs tablet Take 1 tablet by mouth daily at 12 noon.   PROBIOTIC DAILY PO Take by mouth.   sertraline 100 MG tablet Commonly known as: ZOLOFT      -RX and instructions given for pregnancy support belt -discussed use of Tylenol for pain and patient advised not to take more than 4,000mg  daily from all sources -discussed s/sx of PTL -strict bleeding/pain/DFM/return MAU precautions given -pt discharged to home in stable condition  Joni Reining E Joas Motton 03/09/2019, 7:17 PM

## 2019-03-09 NOTE — MAU Note (Signed)
Pt reports she had an episode of intense pelvic pressure pain for a few minutes and it went away. Then has had several more episodes today. Went to doctors appointment and had her on the monitor and was having some ctx. 1cm dilated and baby is "low"

## 2019-03-09 NOTE — Discharge Instructions (Signed)
Safe Medications in Pregnancy    Acne: Benzoyl Peroxide Salicylic Acid  Backache/Headache: Tylenol: 2 regular strength every 4 hours OR              2 Extra strength every 6 hours  Colds/Coughs/Allergies: Benadryl (alcohol free) 25 mg every 6 hours as needed Breath right strips Claritin Cepacol throat lozenges Chloraseptic throat spray Cold-Eeze- up to three times per day Cough drops, alcohol free Flonase (by prescription only) Guaifenesin Mucinex Robitussin DM (plain only, alcohol free) Saline nasal spray/drops Sudafed (pseudoephedrine) & Actifed ** use only after [redacted] weeks gestation and if you do not have high blood pressure Tylenol Vicks Vaporub Zinc lozenges Zyrtec   Constipation: Colace Ducolax suppositories Fleet enema Glycerin suppositories Metamucil Milk of magnesia Miralax Senokot Smooth move tea  Diarrhea: Kaopectate Imodium A-D  *NO pepto Bismol  Hemorrhoids: Anusol Anusol HC Preparation H Tucks  Indigestion: Tums Maalox Mylanta Zantac  Pepcid  Insomnia: Benadryl (alcohol free) 25mg  every 6 hours as needed Tylenol PM Unisom, no Gelcaps  Leg Cramps: Tums MagGel  Nausea/Vomiting:  Bonine Dramamine Emetrol Ginger extract Sea bands Meclizine  Nausea medication to take during pregnancy:  Unisom (doxylamine succinate 25 mg tablets) Take one tablet daily at bedtime. If symptoms are not adequately controlled, the dose can be increased to a maximum recommended dose of two tablets daily (1/2 tablet in the morning, 1/2 tablet mid-afternoon and one at bedtime). Vitamin B6 100mg  tablets. Take one tablet twice a day (up to 200 mg per day).  Skin Rashes: Aveeno products Benadryl cream or 25mg  every 6 hours as needed Calamine Lotion 1% cortisone cream  Yeast infection: Gyne-lotrimin 7 Monistat 7   **If taking multiple medications, please check labels to avoid duplicating the same active ingredients **take  medication as directed on the label ** Do not exceed 4000 mg of tylenol in 24 hours **Do not take medications that contain aspirin or ibuprofen        PREGNANCY SUPPORT BELT: You are not alone, Seventy-five percent of women have some sort of abdominal or back pain at some point in their pregnancy. Your baby is growing at a fast pace, which means that your whole body is rapidly trying to adjust to the changes. As your uterus grows, your back may start feeling a bit under stress and this can result in back or abdominal pain that can go from mild, and therefore bearable, to severe pains that will not allow you to sit or lay down comfortably, When it comes to dealing with pregnancy-related pains and cramps, some pregnant women usually prefer natural remedies, which the market is filled with nowadays. For example, wearing a pregnancy support belt can help ease and lessen your discomfort and pain. WHAT ARE THE BENEFITS OF WEARING A PREGNANCY SUPPORT BELT? A pregnancy support belt provides support to the lower portion of the belly taking some of the weight of the growing uterus and distributing to the other parts of your body. It is designed make you comfortable and gives you extra support. Over the years, the pregnancy apparel market has been studying the needs and wants of pregnant women and they have come up with the most comfortable pregnancy support belts that woman could ever ask for. In fact, you will no longer have to wear a stretched-out or bulky pregnancy belt that is visible underneath your clothes and makes you feel even more uncomfortable. Nowadays, a pregnancy support belt is made of comfortable and stretchy materials that will not irritate your skin  but will actually make you feel at ease and you will not even notice you are wearing it. They are easy to put on and adjust during the day and can be worn at night for additional support.  BENEFITS: . Relives Back pain . Relieves Abdominal Muscle  and Leg Pain . Stabilizes the Pelvic Ring . Offers a Cushioned Abdominal Lift Pad . Relieves pressure on the Sciatic Nerve Within Minutes WHERE TO GET YOUR PREGNANCY BELT: [employer] [phone] @[phone]  [address] [place], [place]    Abdominal Pain During Pregnancy  Abdominal pain is common during pregnancy, and has many possible causes. Some causes are more serious than others, and sometimes the cause is not known. Abdominal pain can be a sign that labor is starting. It can also be caused by normal growth and stretching of muscles and ligaments during pregnancy. Always tell your health care provider if you have any abdominal pain. Follow these instructions at home:  Do not have sex or put anything in your vagina until your pain goes away completely.  Get plenty of rest until your pain improves.  Drink enough fluid to keep your urine pale yellow.  Take over-the-counter and prescription medicines only as told by your health care provider.  Keep all follow-up visits as told by your health care provider. This is important. Contact a health care provider if:  Your pain continues or gets worse after resting.  You have lower abdominal pain that: ? Comes and goes at regular intervals. ? Spreads to your back. ? Is similar to menstrual cramps.  You have pain or burning when you urinate. Get help right away if:  You have a fever or chills.  You have vaginal bleeding.  You are leaking fluid from your vagina.  You are passing tissue from your vagina.  You have vomiting or diarrhea that lasts for more than 24 hours.  Your baby is moving less than usual.  You feel very weak or faint.  You have shortness of breath.  You develop severe pain in your upper abdomen. Summary  Abdominal pain is common during pregnancy, and has many possible causes.  If you experience abdominal pain during pregnancy, tell your health care provider right away.  Follow your  health care provider's home care instructions and keep all follow-up visits as directed. This information is not intended to replace advice given to you by your health care provider. Make sure you discuss any questions you have with your health care provider. Document Revised: 04/07/2018 Document Reviewed: 03/22/2016 Elsevier Patient Education  2020 03/24/2016.  Preterm Labor and Birth Information  The normal length of a pregnancy is 39-41 weeks. Preterm labor is when labor starts before 37 completed weeks of pregnancy. What are the risk factors for preterm labor? Preterm labor is more likely to occur in women who:  Have certain infections during pregnancy such as a bladder infection, sexually transmitted infection, or infection inside the uterus (chorioamnionitis).  Have a shorter-than-normal cervix.  Have gone into preterm labor before.  Have had surgery on their cervix.  Are younger than age 36 or older than age 24.  Are African American.  Are pregnant with twins or multiple babies (multiple gestation).  Take street drugs or smoke while pregnant.  Do not gain enough weight while pregnant.  Became pregnant shortly after having been pregnant. What are the symptoms of preterm labor? Symptoms of preterm labor include:  Cramps similar to those that can happen during  a menstrual period. The cramps may happen with diarrhea.  Pain in the abdomen or lower back.  Regular uterine contractions that may feel like tightening of the abdomen.  A feeling of increased pressure in the pelvis.  Increased watery or bloody mucus discharge from the vagina.  Water breaking (ruptured amniotic sac). Why is it important to recognize signs of preterm labor? It is important to recognize signs of preterm labor because babies who are born prematurely may not be fully developed. This can put them at an increased risk for:  Long-term (chronic) heart and lung problems.  Difficulty immediately  after birth with regulating body systems, including blood sugar, body temperature, heart rate, and breathing rate.  Bleeding in the brain.  Cerebral palsy.  Learning difficulties.  Death. These risks are highest for babies who are born before 27 weeks of pregnancy. How is preterm labor treated? Treatment depends on the length of your pregnancy, your condition, and the health of your baby. It may involve:  Having a stitch (suture) placed in your cervix to prevent your cervix from opening too early (cerclage).  Taking or being given medicines, such as: ? Hormone medicines. These may be given early in pregnancy to help support the pregnancy. ? Medicine to stop contractions. ? Medicines to help mature the baby's lungs. These may be prescribed if the risk of delivery is high. ? Medicines to prevent your baby from developing cerebral palsy. If the labor happens before 34 weeks of pregnancy, you may need to stay in the hospital. What should I do if I think I am in preterm labor? If you think that you are going into preterm labor, call your health care provider right away. How can I prevent preterm labor in future pregnancies? To increase your chance of having a full-term pregnancy:  Do not use any tobacco products, such as cigarettes, chewing tobacco, and e-cigarettes. If you need help quitting, ask your health care provider.  Do not use street drugs or medicines that have not been prescribed to you during your pregnancy.  Talk with your health care provider before taking any herbal supplements, even if you have been taking them regularly.  Make sure you gain a healthy amount of weight during your pregnancy.  Watch for infection. If you think that you might have an infection, get it checked right away.  Make sure to tell your health care provider if you have gone into preterm labor before. This information is not intended to replace advice given to you by your health care provider. Make  sure you discuss any questions you have with your health care provider. Document Revised: 04/11/2018 Document Reviewed: 05/11/2015 Elsevier Patient Education  [place].

## 2019-03-10 ENCOUNTER — Telehealth: Payer: Self-pay | Admitting: Women's Health

## 2019-03-10 NOTE — Telephone Encounter (Signed)
Attempted to call patient x2 to inquire about symptoms today. Left voicemail on morning attempt, no answer, no voicemail left for afternoon attempt.  Marylen Ponto, NP  3:58 PM 03/10/2019

## 2019-03-10 NOTE — MAU Provider Note (Signed)
Spoke with Dr. Langston Masker after patient was discharged, Dr. Langston Masker aware patient did not receive betamethasone during stay in MAU.  Marylen Ponto, NP  9:52 AM 03/10/2019

## 2019-03-11 ENCOUNTER — Encounter: Payer: Commercial Managed Care - PPO | Attending: Obstetrics and Gynecology | Admitting: Registered"

## 2019-03-11 ENCOUNTER — Other Ambulatory Visit: Payer: Self-pay

## 2019-03-11 DIAGNOSIS — O99810 Abnormal glucose complicating pregnancy: Secondary | ICD-10-CM | POA: Insufficient documentation

## 2019-03-11 LAB — GC/CHLAMYDIA PROBE AMP (~~LOC~~) NOT AT ARMC
Chlamydia: NEGATIVE
Comment: NEGATIVE
Comment: NORMAL
Neisseria Gonorrhea: NEGATIVE

## 2019-03-12 ENCOUNTER — Encounter: Payer: Self-pay | Admitting: Registered"

## 2019-03-12 DIAGNOSIS — O99810 Abnormal glucose complicating pregnancy: Secondary | ICD-10-CM | POA: Insufficient documentation

## 2019-03-12 NOTE — Progress Notes (Signed)
Patient was seen on 03/11/19 for Gestational Diabetes self-management class at the [facility]. The following learning objectives were met by the patient during this course:   States the definition of Gestational Diabetes  States why dietary management is important in controlling blood glucose  Describes the effects each nutrient has on blood glucose levels  Demonstrates ability to create a balanced meal plan  Demonstrates carbohydrate counting   States when to check blood glucose levels  Demonstrates proper blood glucose monitoring techniques  States the effect of stress and exercise on blood glucose levels  States the importance of limiting caffeine and abstaining from alcohol and smoking  Blood glucose monitor given: Contour Next Lot # [id number] Exp: 05/01/19 Blood glucose reading: 117 mg/dL  Patient instructed to monitor glucose levels: FBS: 60 - <95; 1 hour: <140; 2 hour: <120  Patient received handouts:  Nutrition Diabetes and Pregnancy, including carb counting list  Patient will be seen for follow-up as needed.

## 2019-04-02 LAB — OB RESULTS CONSOLE GBS: GBS: NEGATIVE

## 2019-04-09 NOTE — H&P (Signed)
Katherine Wilkerson is a 39 y.o. female presenting for scheduled repeat Cesarean section. She has a hx CS for arrest of descent for a 6#8 baby Katherine Wilkerson "ASHER"). She elected a repeat cesarean section. Pregnancy c/b A1GDM with excellent control in addition to anxiety well controlled on sertraline 100mg . Expecting baby boy "[name]"!!     OB History    Gravida  2   Para  1   Term  1   Preterm      AB      Living  1     SAB      TAB      Ectopic      Multiple  0   Live Births  1          Past Medical History:  Diagnosis Date  . Acne   . Anemia    iron deficiency  . Gestational diabetes    Past Surgical History:  Procedure Laterality Date  . CESAREAN SECTION N/A 06/13/2015   Procedure: CESAREAN SECTION;  Surgeon: 08/13/2015, DO;  Location: [facility];  Service: Obstetrics;  Laterality: N/A;  . TONSILLECTOMY     Family History: family history includes Cancer in her father; Thyroid disease in her mother. Social History:  reports that she has never smoked. She has never used smokeless tobacco. She reports that she does not drink alcohol or use drugs.     Maternal Diabetes: Yes:  Diabetes Type:  Diet controlled Genetic Screening: Declined  Maternal Ultrasounds/Referrals: Normal Fetal Ultrasounds or other Referrals:  None Maternal Substance Abuse:  No Significant Maternal Medications:  None Significant Maternal Lab Results:  None Other Comments:  None  Review of Systems History   There were no vitals taken for this visit. Exam Physical Exam  (from office) NAD, A&O NWOB Abd soft, nondistended, gravid Gyn TIGHT, narrow pelvis, 3cm dilated  Prenatal labs: ABO, Rh:   Antibody:   Rubella:   RPR:    HBsAg:    HIV:    GBS:     Assessment/Plan:  38yo  presenting for scheduled repeat CS. Risks discussed including infection, bleeding, damage to surrounding structures, the need for additional procedures including hysterectomy, and the possibility of  uterine rupture with neonatal morbidity/mortality, scarring, and abnormal placentation with subsequent pregnancies. Patient agrees to proceed.  Gent/clinda on call to OR.     Mitchel Honour 04/09/2019, 11:58 AM

## 2019-04-17 NOTE — Patient Instructions (Addendum)
Katherine Wilkerson  04/17/2019   Your procedure is scheduled on:  04/29/2019  Arrive at 0530 at Entrance C on [address] at [facility]  and [employer]. You are invited to use the FREE valet parking or use the Visitor's parking deck.  Pick up the phone at the desk and dial [phone].  Call this number if you have problems the morning of surgery: [phone]  Remember:   Do not eat food:(After Midnight) Desps de medianoche.  Do not drink clear liquids: (After Midnight) Desps de medianoche.  Take these medicines the morning of surgery with A SIP OF WATER:  none   Do not wear jewelry, make-up or nail polish  Do not wear lotions, powders, or perfumes. Do not wear deodorant.  Do not shave 48 hours prior to surgery.  Do not bring valuables to the hospital.  [facility] is not   responsible for any belongings or valuables brought to the hospital.  Contacts, dentures or bridgework may not be worn into surgery.  Leave suitcase in the car. After surgery it may be brought to your room.  For patients admitted to the hospital, checkout time is 11:00 AM the day of              discharge.      Please read over the following fact sheets that you were given:     Preparing for Surgery

## 2019-04-20 ENCOUNTER — Telehealth (HOSPITAL_COMMUNITY): Payer: Self-pay | Admitting: *Deleted

## 2019-04-20 NOTE — Telephone Encounter (Signed)
Preadmission screen  

## 2019-04-21 ENCOUNTER — Encounter (HOSPITAL_COMMUNITY): Payer: Self-pay

## 2019-04-27 ENCOUNTER — Other Ambulatory Visit: Payer: Self-pay

## 2019-04-27 ENCOUNTER — Other Ambulatory Visit (HOSPITAL_COMMUNITY)
Admission: RE | Admit: 2019-04-27 | Discharge: 2019-04-27 | Disposition: A | Discharge status: Home / Self Care | Payer: Commercial Managed Care - PPO | Source: Ambulatory Visit | Attending: Obstetrics and Gynecology | Admitting: Obstetrics and Gynecology

## 2019-04-27 DIAGNOSIS — Z01812 Encounter for preprocedural laboratory examination: Secondary | ICD-10-CM | POA: Insufficient documentation

## 2019-04-27 DIAGNOSIS — Z20822 Contact with and (suspected) exposure to covid-19: Secondary | ICD-10-CM | POA: Insufficient documentation

## 2019-04-27 HISTORY — DX: Other reaction to spinal and lumbar puncture: G97.1

## 2019-04-27 LAB — SARS CORONAVIRUS 2 (TAT 6-24 HRS): SARS Coronavirus 2: NEGATIVE

## 2019-04-27 LAB — CBC
HCT: 34.2 % — ABNORMAL LOW (ref 36.0–46.0)
Hemoglobin: 10.2 g/dL — ABNORMAL LOW (ref 12.0–15.0)
MCH: 24.9 pg — ABNORMAL LOW (ref 26.0–34.0)
MCHC: 29.8 g/dL — ABNORMAL LOW (ref 30.0–36.0)
MCV: 83.6 fL (ref 80.0–100.0)
Platelets: 205 10*3/uL (ref 150–400)
RBC: 4.09 MIL/uL (ref 3.87–5.11)
RDW: 16.5 % — ABNORMAL HIGH (ref 11.5–15.5)
WBC: 6.9 10*3/uL (ref 4.0–10.5)
nRBC: 0 % (ref 0.0–0.2)

## 2019-04-27 LAB — TYPE AND SCREEN
ABO/RH(D): O POS
Antibody Screen: NEGATIVE

## 2019-04-27 LAB — ABO/RH: ABO/RH(D): O POS

## 2019-04-27 LAB — RPR: RPR Ser Ql: NONREACTIVE

## 2019-04-28 NOTE — Anesthesia Preprocedure Evaluation (Signed)
Anesthesia Evaluation  Patient identified by MRN, date of birth, ID band Patient awake    Reviewed: Allergy & Precautions, NPO status , Patient's Chart, lab work & pertinent test results  History of Anesthesia Complications (+) POST - OP SPINAL HEADACHENegative for: history of anesthetic complications  Airway Mallampati: II  TM Distance: >3 FB Neck ROM: Full    Dental no notable dental hx. (+) Dental Advisory Given   Pulmonary neg pulmonary ROS,    Pulmonary exam normal        Cardiovascular negative cardio ROS Normal cardiovascular exam     Neuro/Psych  Headaches, negative psych ROS   GI/Hepatic negative GI ROS, Neg liver ROS,   Endo/Other  diabetes  Renal/GU negative Renal ROS     Musculoskeletal negative musculoskeletal ROS (+)   Abdominal   Peds  Hematology  (+) anemia ,   Anesthesia Other Findings   Reproductive/Obstetrics (+) Pregnancy                             Anesthesia Physical  Anesthesia Plan  ASA: II  Anesthesia Plan: Spinal   Post-op Pain Management:    Induction:   PONV Risk Score and Plan: 4 or greater and Ondansetron, Dexamethasone, Scopolamine patch - Pre-op and Treatment may vary due to age or medical condition  Airway Management Planned:   Additional Equipment:   Intra-op Plan:   Post-operative Plan:   Informed Consent: I have reviewed the patients History and Physical, chart, labs and discussed the procedure including the risks, benefits and alternatives for the proposed anesthesia with the patient or authorized representative who has indicated his/her understanding and acceptance.     Dental advisory given  Plan Discussed with: CRNA  Anesthesia Plan Comments: ( )        Anesthesia Quick Evaluation

## 2019-04-29 ENCOUNTER — Inpatient Hospital Stay (HOSPITAL_COMMUNITY)
Admission: RE | Admit: 2019-04-29 | Discharge: 2019-05-01 | DRG: 788 | Disposition: A | Discharge status: Home / Self Care | Payer: Commercial Managed Care - PPO | Attending: Obstetrics and Gynecology | Admitting: Obstetrics and Gynecology

## 2019-04-29 ENCOUNTER — Inpatient Hospital Stay (HOSPITAL_COMMUNITY): Payer: Commercial Managed Care - PPO | Admitting: Anesthesiology

## 2019-04-29 ENCOUNTER — Encounter (HOSPITAL_COMMUNITY): Payer: Self-pay | Admitting: Obstetrics and Gynecology

## 2019-04-29 ENCOUNTER — Encounter (HOSPITAL_COMMUNITY)
Admission: RE | Disposition: A | Discharge status: Home / Self Care | Payer: Self-pay | Source: Home / Self Care | Attending: Obstetrics and Gynecology

## 2019-04-29 DIAGNOSIS — Z3A39 39 weeks gestation of pregnancy: Secondary | ICD-10-CM

## 2019-04-29 DIAGNOSIS — O34211 Maternal care for low transverse scar from previous cesarean delivery: Principal | ICD-10-CM | POA: Diagnosis present

## 2019-04-29 DIAGNOSIS — Z880 Allergy status to penicillin: Secondary | ICD-10-CM | POA: Diagnosis not present

## 2019-04-29 DIAGNOSIS — O99344 Other mental disorders complicating childbirth: Secondary | ICD-10-CM | POA: Diagnosis present

## 2019-04-29 DIAGNOSIS — F419 Anxiety disorder, unspecified: Secondary | ICD-10-CM | POA: Diagnosis present

## 2019-04-29 DIAGNOSIS — Z20822 Contact with and (suspected) exposure to covid-19: Secondary | ICD-10-CM | POA: Diagnosis present

## 2019-04-29 DIAGNOSIS — Z3490 Encounter for supervision of normal pregnancy, unspecified, unspecified trimester: Secondary | ICD-10-CM

## 2019-04-29 DIAGNOSIS — D509 Iron deficiency anemia, unspecified: Secondary | ICD-10-CM | POA: Diagnosis present

## 2019-04-29 DIAGNOSIS — O9902 Anemia complicating childbirth: Secondary | ICD-10-CM | POA: Diagnosis present

## 2019-04-29 DIAGNOSIS — O24420 Gestational diabetes mellitus in childbirth, diet controlled: Secondary | ICD-10-CM | POA: Diagnosis present

## 2019-04-29 DIAGNOSIS — Z98891 History of uterine scar from previous surgery: Secondary | ICD-10-CM

## 2019-04-29 LAB — GLUCOSE, CAPILLARY
Glucose-Capillary: 75 mg/dL (ref 70–99)
Glucose-Capillary: 91 mg/dL (ref 70–99)

## 2019-04-29 SURGERY — Surgical Case
Anesthesia: Spinal | Site: Abdomen | Wound class: Clean Contaminated

## 2019-04-29 MED ORDER — GENTAMICIN SULFATE 40 MG/ML IJ SOLN
5.0000 mg/kg | INTRAVENOUS | Status: AC
  Administered 2019-04-29: 281 mg via INTRAVENOUS
  Filled 2019-04-29: qty 7

## 2019-04-29 MED ORDER — PHENYLEPHRINE HCL-NACL 20-0.9 MG/250ML-% IV SOLN
INTRAVENOUS | Status: AC
  Filled 2019-04-29: qty 250

## 2019-04-29 MED ORDER — NALOXONE HCL 0.4 MG/ML IJ SOLN: 0.4000 mg | INTRAMUSCULAR | Status: DC | PRN

## 2019-04-29 MED ORDER — NALOXONE HCL 4 MG/10ML IJ SOLN
1.0000 ug/kg/h | INTRAVENOUS | Status: DC | PRN
  Filled 2019-04-29: qty 5

## 2019-04-29 MED ORDER — SIMETHICONE 80 MG PO CHEW: 80.0000 mg | CHEWABLE_TABLET | ORAL | Status: DC | PRN

## 2019-04-29 MED ORDER — FENTANYL CITRATE (PF) 100 MCG/2ML IJ SOLN
INTRAMUSCULAR | Status: DC | PRN
  Administered 2019-04-29: 15 ug via INTRATHECAL

## 2019-04-29 MED ORDER — CLINDAMYCIN PHOSPHATE 900 MG/50ML IV SOLN
INTRAVENOUS | Status: AC
  Filled 2019-04-29: qty 50

## 2019-04-29 MED ORDER — NALBUPHINE HCL 10 MG/ML IJ SOLN: 5.0000 mg | INTRAMUSCULAR | Status: DC | PRN

## 2019-04-29 MED ORDER — SCOPOLAMINE 1 MG/3DAYS TD PT72
1.0000 | MEDICATED_PATCH | Freq: Once | TRANSDERMAL | Status: DC
  Administered 2019-04-29: 1.5 mg via TRANSDERMAL

## 2019-04-29 MED ORDER — DEXAMETHASONE SODIUM PHOSPHATE 10 MG/ML IJ SOLN
INTRAMUSCULAR | Status: DC | PRN
  Administered 2019-04-29: 10 mg via INTRAVENOUS

## 2019-04-29 MED ORDER — DEXAMETHASONE SODIUM PHOSPHATE 10 MG/ML IJ SOLN
INTRAMUSCULAR | Status: AC
  Filled 2019-04-29: qty 1

## 2019-04-29 MED ORDER — SODIUM CHLORIDE 0.9 % IV SOLN
INTRAVENOUS | Status: DC | PRN
Start: 2019-04-29 — End: 2019-04-29

## 2019-04-29 MED ORDER — DIBUCAINE (PERIANAL) 1 % EX OINT: 1.0000 "application " | TOPICAL_OINTMENT | CUTANEOUS | Status: DC | PRN

## 2019-04-29 MED ORDER — BUPIVACAINE IN DEXTROSE 0.75-8.25 % IT SOLN
INTRATHECAL | Status: DC | PRN
  Administered 2019-04-29: 1.5 mL via INTRATHECAL

## 2019-04-29 MED ORDER — KETOROLAC TROMETHAMINE 30 MG/ML IJ SOLN
INTRAMUSCULAR | Status: AC
  Filled 2019-04-29: qty 1

## 2019-04-29 MED ORDER — HYDROMORPHONE HCL 1 MG/ML IJ SOLN: 0.2000 mg | INTRAMUSCULAR | Status: DC | PRN

## 2019-04-29 MED ORDER — SOD CITRATE-CITRIC ACID 500-334 MG/5ML PO SOLN
ORAL | Status: AC
  Filled 2019-04-29: qty 30

## 2019-04-29 MED ORDER — OXYTOCIN 40 UNITS IN NORMAL SALINE INFUSION - SIMPLE MED: 2.5000 [IU]/h | INTRAVENOUS | Status: AC

## 2019-04-29 MED ORDER — NALBUPHINE HCL 10 MG/ML IJ SOLN: 5.0000 mg | Freq: Once | INTRAMUSCULAR | Status: DC | PRN

## 2019-04-29 MED ORDER — SIMETHICONE 80 MG PO CHEW
80.0000 mg | CHEWABLE_TABLET | Freq: Three times a day (TID) | ORAL | Status: DC
  Administered 2019-04-29 – 2019-05-01 (×7): 80 mg via ORAL
  Filled 2019-04-29 (×6): qty 1

## 2019-04-29 MED ORDER — DIPHENHYDRAMINE HCL 50 MG/ML IJ SOLN: 12.5000 mg | INTRAMUSCULAR | Status: DC | PRN

## 2019-04-29 MED ORDER — OXYTOCIN 40 UNITS IN NORMAL SALINE INFUSION - SIMPLE MED
INTRAVENOUS | Status: AC
  Filled 2019-04-29: qty 1000

## 2019-04-29 MED ORDER — DIPHENHYDRAMINE HCL 25 MG PO CAPS: 25.0000 mg | ORAL_CAPSULE | Freq: Four times a day (QID) | ORAL | Status: DC | PRN

## 2019-04-29 MED ORDER — PHENYLEPHRINE HCL-NACL 20-0.9 MG/250ML-% IV SOLN
INTRAVENOUS | Status: DC | PRN
  Administered 2019-04-29: 60 ug/min via INTRAVENOUS

## 2019-04-29 MED ORDER — DIPHENHYDRAMINE HCL 25 MG PO CAPS: 25.0000 mg | ORAL_CAPSULE | ORAL | Status: DC | PRN

## 2019-04-29 MED ORDER — CLINDAMYCIN PHOSPHATE 900 MG/50ML IV SOLN
900.0000 mg | INTRAVENOUS | Status: AC
  Administered 2019-04-29: 900 mg via INTRAVENOUS

## 2019-04-29 MED ORDER — KETOROLAC TROMETHAMINE 30 MG/ML IJ SOLN
30.0000 mg | Freq: Four times a day (QID) | INTRAMUSCULAR | Status: AC
  Administered 2019-04-29 – 2019-04-30 (×3): 30 mg via INTRAVENOUS
  Filled 2019-04-29 (×3): qty 1

## 2019-04-29 MED ORDER — MORPHINE SULFATE (PF) 0.5 MG/ML IJ SOLN
INTRAMUSCULAR | Status: DC | PRN
  Administered 2019-04-29: .15 mg via INTRATHECAL

## 2019-04-29 MED ORDER — SCOPOLAMINE 1 MG/3DAYS TD PT72
MEDICATED_PATCH | TRANSDERMAL | Status: AC
  Filled 2019-04-29: qty 1

## 2019-04-29 MED ORDER — WITCH HAZEL-GLYCERIN EX PADS: 1.0000 "application " | MEDICATED_PAD | CUTANEOUS | Status: DC | PRN

## 2019-04-29 MED ORDER — ONDANSETRON HCL 4 MG/2ML IJ SOLN
INTRAMUSCULAR | Status: AC
  Filled 2019-04-29: qty 2

## 2019-04-29 MED ORDER — MORPHINE SULFATE (PF) 0.5 MG/ML IJ SOLN
INTRAMUSCULAR | Status: AC
  Filled 2019-04-29: qty 10

## 2019-04-29 MED ORDER — OXYCODONE HCL 5 MG PO TABS
5.0000 mg | ORAL_TABLET | ORAL | Status: DC | PRN
  Administered 2019-04-30: 5 mg via ORAL
  Filled 2019-04-29: qty 1

## 2019-04-29 MED ORDER — SENNOSIDES-DOCUSATE SODIUM 8.6-50 MG PO TABS
2.0000 | ORAL_TABLET | ORAL | Status: DC
  Administered 2019-04-29: 23:00:00 2 via ORAL
  Filled 2019-04-29 (×2): qty 2

## 2019-04-29 MED ORDER — SIMETHICONE 80 MG PO CHEW
80.0000 mg | CHEWABLE_TABLET | ORAL | Status: DC
  Administered 2019-04-30: 23:00:00 80 mg via ORAL
  Filled 2019-04-29 (×2): qty 1

## 2019-04-29 MED ORDER — EPHEDRINE 5 MG/ML INJ
INTRAVENOUS | Status: AC
  Filled 2019-04-29: qty 10

## 2019-04-29 MED ORDER — COCONUT OIL OIL: 1.0000 "application " | TOPICAL_OIL | Status: DC | PRN

## 2019-04-29 MED ORDER — SOD CITRATE-CITRIC ACID 500-334 MG/5ML PO SOLN
30.0000 mL | ORAL | Status: AC
  Administered 2019-04-29: 07:00:00 30 mL via ORAL

## 2019-04-29 MED ORDER — ONDANSETRON HCL 4 MG/2ML IJ SOLN: 4.0000 mg | Freq: Three times a day (TID) | INTRAMUSCULAR | Status: DC | PRN

## 2019-04-29 MED ORDER — TETANUS-DIPHTH-ACELL PERTUSSIS 5-2.5-18.5 LF-MCG/0.5 IM SUSP: 0.5000 mL | Freq: Once | INTRAMUSCULAR | Status: DC

## 2019-04-29 MED ORDER — KETOROLAC TROMETHAMINE 30 MG/ML IJ SOLN
30.0000 mg | Freq: Once | INTRAMUSCULAR | Status: AC | PRN
  Administered 2019-04-29: 30 mg via INTRAVENOUS

## 2019-04-29 MED ORDER — ACETAMINOPHEN 500 MG PO TABS
1000.0000 mg | ORAL_TABLET | Freq: Four times a day (QID) | ORAL | Status: DC
  Administered 2019-04-29 – 2019-05-01 (×9): 1000 mg via ORAL
  Filled 2019-04-29 (×9): qty 2

## 2019-04-29 MED ORDER — PROMETHAZINE HCL 25 MG/ML IJ SOLN: 6.2500 mg | INTRAMUSCULAR | Status: DC | PRN

## 2019-04-29 MED ORDER — SERTRALINE HCL 100 MG PO TABS
100.0000 mg | ORAL_TABLET | Freq: Every day | ORAL | Status: DC
  Administered 2019-04-29 – 2019-04-30 (×2): 100 mg via ORAL
  Filled 2019-04-29 (×2): qty 1

## 2019-04-29 MED ORDER — MEPERIDINE HCL 25 MG/ML IJ SOLN: 6.2500 mg | INTRAMUSCULAR | Status: DC | PRN

## 2019-04-29 MED ORDER — ONDANSETRON HCL 4 MG/2ML IJ SOLN
INTRAMUSCULAR | Status: DC | PRN
  Administered 2019-04-29: 4 mg via INTRAVENOUS

## 2019-04-29 MED ORDER — FENTANYL CITRATE (PF) 100 MCG/2ML IJ SOLN
INTRAMUSCULAR | Status: AC
  Filled 2019-04-29: qty 2

## 2019-04-29 MED ORDER — OXYTOCIN 40 UNITS IN NORMAL SALINE INFUSION - SIMPLE MED
INTRAVENOUS | Status: DC | PRN
  Administered 2019-04-29: 40 [IU] via INTRAVENOUS

## 2019-04-29 MED ORDER — LACTATED RINGERS IV SOLN: INTRAVENOUS | Status: DC

## 2019-04-29 MED ORDER — SODIUM CHLORIDE 0.9% FLUSH: 3.0000 mL | INTRAVENOUS | Status: DC | PRN

## 2019-04-29 MED ORDER — IBUPROFEN 800 MG PO TABS
800.0000 mg | ORAL_TABLET | Freq: Four times a day (QID) | ORAL | Status: DC
  Administered 2019-04-30 – 2019-05-01 (×5): 800 mg via ORAL
  Filled 2019-04-29 (×5): qty 1

## 2019-04-29 MED ORDER — ZOLPIDEM TARTRATE 5 MG PO TABS: 5.0000 mg | ORAL_TABLET | Freq: Every evening | ORAL | Status: DC | PRN

## 2019-04-29 MED ORDER — HYDROMORPHONE HCL 1 MG/ML IJ SOLN: 0.2500 mg | INTRAMUSCULAR | Status: DC | PRN

## 2019-04-29 MED ORDER — PRENATAL MULTIVITAMIN CH
1.0000 | ORAL_TABLET | Freq: Every day | ORAL | Status: DC
  Filled 2019-04-29 (×2): qty 1

## 2019-04-29 MED ORDER — MENTHOL 3 MG MT LOZG: 1.0000 | LOZENGE | OROMUCOSAL | Status: DC | PRN

## 2019-04-29 MED ORDER — LACTATED RINGERS IV SOLN: INTRAVENOUS | Status: DC | PRN

## 2019-04-29 SURGICAL SUPPLY — 35 items
APL SKNCLS STERI-STRIP NONHPOA (GAUZE/BANDAGES/DRESSINGS) ×1
BENZOIN TINCTURE PRP APPL 2/3 (GAUZE/BANDAGES/DRESSINGS) ×2 IMPLANT
CHLORAPREP W/TINT 26ML (MISCELLANEOUS) ×2 IMPLANT
CLAMP CORD UMBIL (MISCELLANEOUS) IMPLANT
CLOTH BEACON ORANGE TIMEOUT ST (SAFETY) ×2 IMPLANT
CLSR STERI-STRIP ANTIMIC 1/2X4 (GAUZE/BANDAGES/DRESSINGS) ×2 IMPLANT
DRSG OPSITE POSTOP 4X10 (GAUZE/BANDAGES/DRESSINGS) ×2 IMPLANT
ELECT REM PT RETURN 9FT ADLT (ELECTROSURGICAL) ×2
ELECTRODE REM PT RTRN 9FT ADLT (ELECTROSURGICAL) ×1 IMPLANT
EXTRACTOR VACUUM KIWI (MISCELLANEOUS) IMPLANT
GLOVE BIO SURGEON STRL SZ 6.5 (GLOVE) ×2 IMPLANT
GLOVE BIOGEL PI IND STRL 6.5 (GLOVE) ×1 IMPLANT
GLOVE BIOGEL PI IND STRL 7.0 (GLOVE) ×2 IMPLANT
GLOVE BIOGEL PI INDICATOR 6.5 (GLOVE) ×1
GLOVE BIOGEL PI INDICATOR 7.0 (GLOVE) ×2
GOWN STRL REUS W/TWL LRG LVL3 (GOWN DISPOSABLE) ×4 IMPLANT
KIT ABG SYR 3ML LUER SLIP (SYRINGE) ×2 IMPLANT
NDL HYPO 25X5/8 SAFETYGLIDE (NEEDLE) ×1 IMPLANT
NEEDLE HYPO 25X5/8 SAFETYGLIDE (NEEDLE) ×2 IMPLANT
NS IRRIG 1000ML POUR BTL (IV SOLUTION) ×2 IMPLANT
PACK C SECTION WH (CUSTOM PROCEDURE TRAY) ×2 IMPLANT
PAD ABD 7.5X8 STRL (GAUZE/BANDAGES/DRESSINGS) ×1 IMPLANT
PAD OB MATERNITY 4.3X12.25 (PERSONAL CARE ITEMS) ×2 IMPLANT
PENCIL SMOKE EVAC W/HOLSTER (ELECTROSURGICAL) ×2 IMPLANT
SPONGE LAP 18X18 RF (DISPOSABLE) ×1 IMPLANT
SUT PLAIN 0 NONE (SUTURE) IMPLANT
SUT PLAIN 2 0 (SUTURE) ×2
SUT PLAIN ABS 2-0 CT1 27XMFL (SUTURE) ×1 IMPLANT
SUT VIC AB 0 CT1 36 (SUTURE) ×3 IMPLANT
SUT VIC AB 0 CTX 36 (SUTURE) ×4
SUT VIC AB 0 CTX36XBRD ANBCTRL (SUTURE) ×2 IMPLANT
SUT VIC AB 4-0 PS2 27 (SUTURE) ×2 IMPLANT
TOWEL OR 17X24 6PK STRL BLUE (TOWEL DISPOSABLE) ×2 IMPLANT
TRAY FOLEY W/BAG SLVR 14FR LF (SET/KITS/TRAYS/PACK) IMPLANT
WATER STERILE IRR 1000ML POUR (IV SOLUTION) ×2 IMPLANT

## 2019-04-29 NOTE — Progress Notes (Signed)
No updates to above H&P. Patient arrived NPO and was consented in PACU. Risks again discussed, all questions answered, and consent signed. Proceed with above surgery.    Tyreisha Ungar MD  

## 2019-04-29 NOTE — Op Note (Signed)
PROCEDURE DATE: 04/29/19  PREOPERATIVE DIAGNOSIS: Intrauterine pregnancy at 39.1 wga, Indication: repeat  POSTOPERATIVE DIAGNOSIS:The same  PROCEDURE: repeat Low TransverseCesarean Section with retrofilling of bladder, lysis of adhesions  SURGEON: Dr. Belva Agee  INDICATIONS:This is a 39yo G2P1 at 60.1 wga requiring cesarean section secondary to desire repeat CS.  Decision made to proceed with RLTCS.The risks of cesarean section discussed with the patient included but were not limited to: bleeding which may require transfusion or reoperation; infection which may require antibiotics; injury to bowel, bladder, ureters or other surrounding organs; injury to the fetus; need for additional procedures including hysterectomy in the event of a life-threatening hemorrhage; placental abnormalities wth subsequent pregnancies, incisional problems, thromboembolic phenomenon and other postoperative/anesthesia complications. The patient agreed with the proposed plan, giving informed consent for the procedure.   FINDINGS: Viable maleinfant in vertex presentation,APGARs pending, Weight pending, Amniotic fluid clear, Intact placenta, three vessel cord. Grossly normal uterus. .  ANESTHESIA: Epidural ESTIMATED BLOOD LOSS: 214cc SPECIMENS: Placenta for routine COMPLICATIONS: None immediate   PROCEDURE IN DETAIL: The patient received intravenous antibiotics  and had sequential compression devices applied to her lower extremities while in the preoperative area. Shewasthen taken to the operating roomwhere epidural anesthesiawas dosed up to surgical level andwas found to be adequate. She was then placed in a dorsal supine position with a leftward tilt,and prepped and draped in a sterile manner.A foley catheter was placed into her bladder and attached to constant gravity. After an adequate timeout was performed, aPfannenstiel skin incision was made with scalpel and carried through to  the underlying layer of fascia. The fascia was incised in the midline and this incision was extended bilaterally using the Mayo scissors. Kocher clamps were applied to the superior aspect of the fascial incision and the underlying rectus muscles were dissected off bluntly. A similar process was carried out on the inferior aspect of the facial incision. The rectus muscles were separated in the midline bluntly and the peritoneum was entered bluntly. The bladder was noted to be prominent and mildly adherent to the muscles and was gently sharply dissected off.  A bladder flap was created sharply and developed bluntly.Atransverse hysterotomy was made with a scalpel and extended bilaterally bluntly. The bladder blade was then removed. The infant was successfully delivered, and cord was clamped and cut and infant was handed over to awaiting neonatology team. Uterine massage was then administered and the placenta delivered intact with three-vessel cord. Cord gases were taken. The uterus was cleared of clot and debris. The hysterotomy was closed with 0 vicryl.A second imbricating suture of 0-vicryl was used to reinforce the incision and aid in hemostasis. Bladder was retrofilled and no defect noted. The fascia was closed with 0-Vicryl in a running fashion with good restoration of anatomy. The subcutaneus tissue was irrigated and was reapproximated using three interrupted plain gut stitches. The skin was closed with 4-0 Vicryl in a subcuticular fashion.   Final EBL was 214cc (all surgical site and was hemostatic at end of procedure) without any further bleeding on exam.    It's a boy - "[name]" to join brother Clydene Pugh!!  D/w patient probably okay for third cesarean section but may consider additional skin incision above prior.   Pt tolerated the procedure well. All sponge/lap/needle counts were correct X 2. Pt taken to recovery room in stable condition.   Belva Agee MD

## 2019-04-29 NOTE — Transfer of Care (Signed)
Immediate Anesthesia Transfer of Care Note  Patient: Katherine Wilkerson  Procedure(s) Performed: CESAREAN SECTION (N/A Abdomen)  Patient Location: PACU  Anesthesia Type:Spinal  Level of Consciousness: awake, alert  and oriented  Airway & Oxygen Therapy: Patient Spontanous Breathing  Post-op Assessment: Report given to RN and Post -op Vital signs reviewed and stable  Post vital signs: Reviewed and stable  Last Vitals:  Vitals Value Taken Time  BP 100/50 04/29/19 0841  Temp    Pulse 66 04/29/19 0845  Resp 18 04/29/19 0845  SpO2 96 % 04/29/19 0845  Vitals shown include unvalidated device data.  Last Pain:  Vitals:   04/29/19 0602  TempSrc: Oral         Complications: No apparent anesthesia complications

## 2019-04-29 NOTE — Anesthesia Procedure Notes (Signed)
Spinal  Patient location during procedure: OR Staffing Anesthesiologist: Jelina Paulsen, MD Preanesthetic Checklist Completed: patient identified, IV checked, site marked, risks and benefits discussed, surgical consent, monitors and equipment checked, pre-op evaluation and timeout performed Spinal Block Patient position: sitting Prep: DuraPrep Patient monitoring: heart rate, cardiac monitor, continuous pulse ox and blood pressure Approach: midline Location: L3-4 Injection technique: single-shot Needle Needle type: Sprotte  Needle gauge: 24 G Needle length: 9 cm Assessment Sensory level: T4     

## 2019-04-29 NOTE — Lactation Note (Signed)
This note was copied from a baby's chart. Lactation Consultation Note: Mother is a P2, infant is 2 hours old   Mother was given [facility] brochure and basic teaching done.  Staff nurse reports that infant latched on poorly and that she fit mother with a NS.  Mother latched with a #24 NS when LC arrived in the room. Infant sliping on and off the shield. Observed milk in the shield.  LC was able to get the infant latched on the base breast . infnat sustained the latch for 10 more mins with observed swallows.   Reviewed hand expression with mother. Observed large drops of colostrum. Mother was given a harmony hand pump with instructions. Mothers nipples are flat with compressible breast tissue. No observed trama of mothers nipples.   Mother was observed with infant latched on at the Observed infant suckling with audible swallows. Infant sustained latch for    Mother to continue to cue base feed infant and feed at least 8-12 times or more in 24 hours and advised to allow for cluster feeding infant as needed.   Mother to continue to due STS. Mother is aware of available LC services at [facility], BFSG'S, OP Dept, and phone # for questions or concerns about breastfeeding.  Mother receptive to all teaching and plan of care.    Patient Name: Boy Bella Brummet [id number] Date: 04/29/2019 Reason for consult: Initial assessment   Maternal Data Has patient been taught Hand Expression?: Yes Does the patient have breastfeeding experience prior to this delivery?: Yes  Feeding Feeding Type: Breast Fed  LATCH Score Latch: Grasps breast easily, tongue down, lips flanged, rhythmical sucking.  Audible Swallowing: A few with stimulation  Type of Nipple: Everted at rest and after stimulation  Comfort (Breast/Nipple): Soft / non-tender  Hold (Positioning): Assistance needed to correctly position infant at breast and maintain latch.  LATCH Score: 8  Interventions Interventions: Assisted with latch;Skin to  skin;Hand express;Breast compression;Adjust position;Support pillows;Position options;Hand pump;DEBP  Lactation Tools Discussed/Used Tools: (no shield) Nipple shield size: 20   Consult Status Consult Status: Follow-up Date: 04/30/19 Follow-up type: In-patient    Stevan Born [facility] 04/29/2019, 3:20 PM

## 2019-04-29 NOTE — Anesthesia Postprocedure Evaluation (Signed)
Anesthesia Post Note  Patient: Mozelle Remlinger Musco  Procedure(s) Performed: CESAREAN SECTION (N/A Abdomen)     Patient location during evaluation: PACU Anesthesia Type: Spinal Level of consciousness: awake and alert Pain management: pain level controlled Vital Signs Assessment: post-procedure vital signs reviewed and stable Respiratory status: spontaneous breathing Cardiovascular status: stable Postop Assessment: spinal receding Anesthetic complications: no    Last Vitals:  Vitals:   04/29/19 0955 04/29/19 1052  BP: (!) 100/54 (!) 101/56  Pulse: (!) 53 (!) 53  Resp: 18   Temp: (!) 36.3 C 36.6 C  SpO2: 98% 96%    Last Pain:  Vitals:   04/29/19 1052  TempSrc: Oral  PainSc:                  Lewie Loron

## 2019-04-30 ENCOUNTER — Encounter: Payer: Self-pay | Admitting: *Deleted

## 2019-04-30 LAB — CBC
HCT: 26.8 % — ABNORMAL LOW (ref 36.0–46.0)
Hemoglobin: 8 g/dL — ABNORMAL LOW (ref 12.0–15.0)
MCH: 25.3 pg — ABNORMAL LOW (ref 26.0–34.0)
MCHC: 29.9 g/dL — ABNORMAL LOW (ref 30.0–36.0)
MCV: 84.8 fL (ref 80.0–100.0)
Platelets: 156 10*3/uL (ref 150–400)
RBC: 3.16 MIL/uL — ABNORMAL LOW (ref 3.87–5.11)
RDW: 16.6 % — ABNORMAL HIGH (ref 11.5–15.5)
WBC: 10.4 10*3/uL (ref 4.0–10.5)
nRBC: 0 % (ref 0.0–0.2)

## 2019-04-30 LAB — BIRTH TISSUE RECOVERY COLLECTION (PLACENTA DONATION)

## 2019-04-30 NOTE — Lactation Note (Signed)
This note was copied from a baby's chart. Lactation Consultation Note  Patient Name: Katherine Wilkerson [id number] Date: 04/30/2019 Reason for consult: Follow-up assessment;Term;Other (Comment)(post circumcision) Baby 39hrs old, swaddled and asleep in bassinet, mom resting in bed, dad resting on couch. Mom reports last feeding at 8p baby fed using nipple shield, reports baby "on and off", supplemented with 38ml formula using curved tip syringe. Due to use of nipple shield advised to pump with DEBP after feedings to stimulate breasts. Mom reports baby fed better prior to circumcision. Plan 1. Latch baby to breast using nipple shield, (add drops of milk to shield prior to latching, advised to notify RN if assistance needed). 2. If baby still with hunger cues dad will supplement using foley cup (reviewed supplementation sheet) 3. Mom will pump using DEBP. Mom and dad voiced understanding and with no further concerns. Notified RN of above plan. BGilliam, RN, IBCLC with R.Ollison, IBCLC  Maternal Data Formula Feeding for Exclusion: Yes  Feeding     Interventions Interventions: DEBP  Lactation Tools Discussed/Used     Consult Status Consult Status: Follow-up Date: 05/01/19 Follow-up type: In-patient    Charlynn Court 04/30/2019, 9:28 PM

## 2019-04-30 NOTE — Progress Notes (Signed)
Subjective: Postpartum Day 1: Cesarean Delivery Patient reports tolerating PO and no problems voiding.  Patient requests circ.   Objective: Vital signs in last 24 hours: Temp:  [97.4 F (36.3 C)-98.2 F (36.8 C)] 98 F (36.7 C) (04/29 0226) Pulse Rate:  [51-61] 56 (04/29 0226) Resp:  [16-20] 16 (04/29 0226) BP: (94-106)/(50-81) 96/50 (04/29 0226) SpO2:  [96 %-99 %] 98 % (04/28 1840)  Physical Exam:  General: alert, cooperative and appears stated age Lochia: appropriate Uterine Fundus: firm Incision: healing well, no significant drainage, no dehiscence DVT Evaluation: No evidence of DVT seen on physical exam. Negative Homan's sign. No cords or calf tenderness.  Recent Labs    04/30/19 0449  HGB 8.0*  HCT 26.8*    Assessment/Plan: Status post Cesarean section. Doing well postoperatively.  Continue current care. Patient counseled for circ including risk of bleeding, infection, and scarring.  All questions were answered and we will proceed.  Mitchel Honour 04/30/2019, 9:14 AM

## 2019-05-01 MED ORDER — IBUPROFEN 600 MG PO TABS
600.0000 mg | ORAL_TABLET | Freq: Four times a day (QID) | ORAL | 0 refills | Status: DC | PRN
Start: 2019-05-01 — End: 2021-07-21

## 2019-05-01 MED ORDER — DOCUSATE SODIUM 100 MG PO CAPS
100.0000 mg | ORAL_CAPSULE | Freq: Two times a day (BID) | ORAL | 2 refills | Status: AC
Start: 2019-05-01 — End: ?

## 2019-05-01 MED ORDER — OXYCODONE HCL 5 MG PO TABS: 5.0000 mg | ORAL_TABLET | ORAL | 0 refills | Status: DC | PRN

## 2019-05-01 MED ORDER — FERROUS SULFATE 325 (65 FE) MG PO TBEC
325.0000 mg | DELAYED_RELEASE_TABLET | Freq: Two times a day (BID) | ORAL | 2 refills | Status: AC
Start: 2019-05-01 — End: ?

## 2019-05-01 NOTE — Lactation Note (Signed)
This note was copied from a baby's chart. Lactation Consultation Note  Patient Name: Boy Adyn Hoes [id number] Date: 05/01/2019 Reason for consult: Follow-up assessment;Term;Infant weight loss;Other (Comment);Nipple pain/trauma(7 % weight loss)  Baby is 50 hours old  As LC entered the room baby asleep on mom chest.  Per dad baby last fed 7:45 am/ formula 30 ml with a syringe. Wet at 1000 am.. LC reviewed and updated the doc flow sheets per dad.  Per mom has been using a #20 Nipple shield and has not been pumping in the hospital.  Plans to do post pumping at home with her DEBP.  LC stressed the importance of adding post pumping due to the use of the Nipple shield  And decreased stimulation.  Mom mentioned her nipples are tender, LC offered and mom declined.  LC offered comfort gels and mom declined and said she had coconut oil at home and planned to use it.  Sore nipple and engorgement prevention and tx reviewed.  LC recommended and offered to request and LC O/P and mom declined.  LC recommended to check with [facility] to see if they have an LC in the office.  Mom mentioned he last baby weaned at 4 months from the Nipple Shield .  Mom has the [facility] pamphlet with phone numbers.  .      Maternal Data    Feeding Feeding Type: (per dad baby last fed at 745 30 ml formula)  LATCH Score                   Interventions Interventions: Breast feeding basics reviewed  Lactation Tools Discussed/Used Nipple shield size: 20(see LC note) Pump Review: Milk Storage   Consult Status Consult Status: Follow-up(LC offered to place a request for LC O/P and mom declined - see LC note) Follow-up type: Out-patient    Matilde Sprang Xzavior Reinig 05/01/2019, 10:38 AM

## 2019-05-01 NOTE — Discharge Summary (Signed)
OB Discharge Summary     Patient Name: Katherine Wilkerson DOB: Nov 27, 1980 MRN: [id number]  Date of admission: 04/29/2019 Delivering MD: Ranae Pila   Date of discharge: 05/01/2019  Admitting diagnosis: Pregnant [Z34.90] Intrauterine pregnancy: [redacted]w[redacted]d     Secondary diagnosis:  Active Problems:   Pregnant  Additional problems: None     Discharge diagnosis: Term Pregnancy Delivered                                                                                                Post partum procedures:none  Augmentation: na  Complications: None  Hospital course:  Sceduled C/S   39 y.o. yo G2P2002 at [redacted]w[redacted]d was admitted to the hospital 04/29/2019 for scheduled cesarean section with the following indication:Elective Repeat.  Membrane Rupture Time/Date: 7:54 AM ,04/29/2019   Patient delivered a Viable infant.04/29/2019  Details of operation can be found in separate operative note.  Pateint had an uncomplicated postpartum course.  She is ambulating, tolerating a regular diet, passing flatus, and urinating well. Patient is discharged home in stable condition on  05/01/19         Physical exam  Vitals:   04/30/19 0226 04/30/19 1407 04/30/19 2010 05/01/19 0521  BP: (!) 96/50 103/66 (!) 93/59 (!) 95/58  Pulse: (!) 56 98 67 61  Resp: 16 16 20 20   Temp: 98 F (36.7 C) 98.6 F (37 C) 97.6 F (36.4 C) 97.8 F (36.6 C)  TempSrc: Oral Oral Oral Oral  SpO2:   98% 96%  Weight:      Height:       General: alert Lochia: appropriate Uterine Fundus: firm Incision: Healing well with no significant drainage DVT Evaluation: No evidence of DVT seen on physical exam. Labs: Lab Results  Component Value Date   WBC 10.4 04/30/2019   HGB 8.0 (L) 04/30/2019   HCT 26.8 (L) 04/30/2019   MCV 84.8 04/30/2019   PLT 156 04/30/2019   CMP Latest Ref Rng & Units 10/29/2016  Glucose 70 - 99 mg/dL 92  BUN 6 - 23 mg/dL 9  Creatinine 10/31/2016 - 7.82 mg/dL 9.56  Sodium 2.13 - 086 mEq/L 140   Potassium 3.5 - 5.1 mEq/L 3.9  Chloride 96 - 112 mEq/L 107  CO2 19 - 32 mEq/L 24  Calcium 8.4 - 10.5 mg/dL 9.1  Total Protein 6.0 - 8.5 g/dL -  Total Bilirubin 0.0 - 1.2 mg/dL -  Alkaline Phos 39 - 578 IU/L -  AST 0 - 40 IU/L -  ALT 0 - 32 IU/L -    Discharge instruction: per After Visit Summary and "Baby and Me Booklet".  After visit meds:  Allergies as of 05/01/2019      Reactions   Amoxil [amoxicillin] Anaphylaxis, Rash   Has patient had a PCN reaction causing immediate rash, facial/tongue/throat swelling, SOB or lightheadedness with hypotension: Yes Has patient had a PCN reaction causing severe rash involving mucus membranes or skin necrosis: Yes Has patient had a PCN reaction that required hospitalization No Has patient had a PCN reaction occurring within the last 10 years: No If all  of the above answers are "NO", then may proceed with Cephalosporin use.   Doxycycline Rash   Keflex [cephalexin] Rash   Septra [sulfamethoxazole-trimethoprim] Rash   Sulfa Antibiotics Rash      Medication List    TAKE these medications   Comfort Fit Maternity Supp Sm Misc 1 Units by Does not apply route daily as needed.   docusate sodium 100 MG capsule Commonly known as: Colace Take 1 capsule (100 mg total) by mouth 2 (two) times daily.   ferrous sulfate 325 (65 FE) MG EC tablet Take 1 tablet (325 mg total) by mouth 2 (two) times daily.   fluticasone 50 MCG/ACT nasal spray Commonly known as: FLONASE PLACE 1 SPRAY INTO BOTH NOSTRILS 2 (TWO) TIMES DAILY.   ibuprofen 600 MG tablet Commonly known as: ADVIL Take 1 tablet (600 mg total) by mouth every 6 (six) hours as needed.   oxyCODONE 5 MG immediate release tablet Commonly known as: Oxy IR/ROXICODONE Take 1 tablet (5 mg total) by mouth every 4 (four) hours as needed for severe pain.   prenatal multivitamin Tabs tablet Take 1 tablet by mouth daily at 12 noon.   PROBIOTIC DAILY PO Take by mouth.   sertraline 100 MG  tablet Commonly known as: ZOLOFT       Diet: routine diet  Activity: Advance as tolerated. Pelvic rest for 6 weeks.   Outpatient follow up:6 weeks Follow up Appt:No future appointments. Follow up Visit:No follow-ups on file.  Postpartum contraception: Not Discussed  Newborn Data: Live born female  Birth Weight: 7 lb 0.4 oz (3185 g) APGAR: 8, 9  Newborn Delivery   Birth date/time: 04/29/2019 07:56:00 Delivery type: C-Section, Low Transverse Trial of labor: No C-section categorization: Repeat      Baby Feeding: Breast Disposition:home with mother   05/01/2019 Tyson Dense, MD

## 2019-05-05 ENCOUNTER — Inpatient Hospital Stay (HOSPITAL_COMMUNITY): Admission: RE | Admit: 2019-05-05 | Payer: Commercial Managed Care - PPO | Source: Home / Self Care

## 2021-07-21 ENCOUNTER — Inpatient Hospital Stay (HOSPITAL_COMMUNITY)
Admission: AD | Admit: 2021-07-21 | Discharge: 2021-07-21 | Disposition: A | Discharge status: Home / Self Care | Payer: Commercial Managed Care - PPO | Attending: Obstetrics & Gynecology | Admitting: Obstetrics & Gynecology

## 2021-07-21 ENCOUNTER — Encounter (HOSPITAL_COMMUNITY): Payer: Self-pay | Admitting: Obstetrics & Gynecology

## 2021-07-21 DIAGNOSIS — O3680X0 Pregnancy with inconclusive fetal viability, not applicable or unspecified: Secondary | ICD-10-CM | POA: Diagnosis not present

## 2021-07-21 DIAGNOSIS — Z3A01 Less than 8 weeks gestation of pregnancy: Secondary | ICD-10-CM

## 2021-07-21 DIAGNOSIS — O209 Hemorrhage in early pregnancy, unspecified: Secondary | ICD-10-CM

## 2021-07-21 DIAGNOSIS — O4691 Antepartum hemorrhage, unspecified, first trimester: Secondary | ICD-10-CM | POA: Diagnosis not present

## 2021-07-21 LAB — CBC
HCT: 36 % (ref 36.0–46.0)
Hemoglobin: 11.1 g/dL — ABNORMAL LOW (ref 12.0–15.0)
MCH: 23.8 pg — ABNORMAL LOW (ref 26.0–34.0)
MCHC: 30.8 g/dL (ref 30.0–36.0)
MCV: 77.1 fL — ABNORMAL LOW (ref 80.0–100.0)
Platelets: 323 10*3/uL (ref 150–400)
RBC: 4.67 MIL/uL (ref 3.87–5.11)
RDW: 15.4 % (ref 11.5–15.5)
WBC: 8.3 10*3/uL (ref 4.0–10.5)
nRBC: 0 % (ref 0.0–0.2)

## 2021-07-21 LAB — HCG, QUANTITATIVE, PREGNANCY: hCG, Beta Chain, Quant, S: 42 m[IU]/mL — ABNORMAL HIGH (ref <5)

## 2021-07-21 NOTE — MAU Provider Note (Signed)
Event Date/Time   First Provider Initiated Contact with Patient 07/21/21 1952      S Ms. Katherine Wilkerson is a 41 y.o. [id number] patient who presents to MAU today with complaint of missed period with an LMP of 07/11/2021. She got concerned and had (+) HPT x4. Last night she started having abdominal cramping. She woke up this morning with spotting. As the day has gone by, the VB got heavier like a period. She is still having mild to moderate cramping. She is a patient of [facility], but did not call the office because she wanted to know what was going on. Her spouse is present and contributing to the history taking.   O BP 113/67   Pulse 81   Temp 98.4 F (36.9 C)   Resp 18   Ht 5\' 1"  (1.549 m)   Wt 165 lb 11.2 oz (75.2 kg)   LMP 06/11/2021   BMI 31.31 kg/m   Results for orders placed or performed during the hospital encounter of 07/21/21 (from the past 24 hour(s))  hCG, quantitative, pregnancy     Status: Abnormal   Collection Time: 07/21/21  7:50 PM  Result Value Ref Range   hCG, Beta Chain, Quant, S 42 (H) <5 mIU/mL  CBC     Status: Abnormal   Collection Time: 07/21/21  9:15 PM  Result Value Ref Range   WBC 8.3 4.0 - 10.5 K/uL   RBC 4.67 3.87 - 5.11 MIL/uL   Hemoglobin 11.1 (L) 12.0 - 15.0 g/dL   HCT 07/23/21 24.0 - 97.3 %   MCV 77.1 (L) 80.0 - 100.0 fL   MCH 23.8 (L) 26.0 - 34.0 pg   MCHC 30.8 30.0 - 36.0 g/dL   RDW 53.2 99.2 - 42.6 %   Platelets 323 150 - 400 K/uL   nRBC 0.0 0.0 - 0.2 %    Physical Exam Vitals and nursing note reviewed.  Constitutional:      Appearance: Normal appearance. She is normal weight.  Cardiovascular:     Rate and Rhythm: Normal rate.  Pulmonary:     Effort: Pulmonary effort is normal.  Genitourinary:    Comments: Deferred Musculoskeletal:        General: Normal range of motion.  Neurological:     Mental Status: She is alert and oriented to person, place, and time.  Psychiatric:        Mood and Affect: Mood normal.         Behavior: Behavior normal.        Thought Content: Thought content normal.        Judgment: Judgment normal.     A Medical screening exam complete Pregnancy of unknown anatomic location  Vaginal bleeding in pregnancy, first trimester  [redacted] weeks gestation of pregnancy   P Discharge from MAU in stable condition Information provided on VB in pregnancy and ectopic pregnancy (just for s/sx's to look for)  Patient instructed to return to MAU for repeat HCG on Sunday 07/23/2021 Warning signs for worsening condition that would warrant emergency follow-up discussed Return to MAU: If you have heavier bleeding that soaks through more that 2 pads per hour for an hour or more If you bleed so much that you feel like you might pass out or you do pass out If you have significant abdominal pain that is not improved with Tylenol 1000 mg every 8 hours as needed for pain If you develop a fever > 100.5   07/25/2021, CNM  07/21/2021 7:54 PM

## 2021-07-21 NOTE — Discharge Instructions (Signed)
Return to MAU: If you have heavier bleeding that soaks through more that 2 pads per hour for an hour or more If you bleed so much that you feel like you might pass out or you do pass out If you have significant abdominal pain that is not improved with Tylenol 1000 mg every 8 hours as needed for pain If you develop a fever > 100.5  

## 2021-07-21 NOTE — MAU Note (Signed)
.  Katherine Wilkerson is a 41 y.o. at Unknown here in MAU reporting: had +HPT last week(x4). Started having some abd cramping last night. Woke up thi morning and had some spotting. Bleeding has become heavier as the day has gone by( Like a period). Still having small to moderate cramping  LMP: 06/11/2021 Onset of complaint: last night Pain score: 4 Vitals:   07/21/21 1939  BP: 113/67  Pulse: 81  Resp: 18  Temp: 98.4 F (36.9 C)      Lab orders placed from triage:  BHCG

## 2021-07-23 ENCOUNTER — Ambulatory Visit (HOSPITAL_COMMUNITY)
Admit: 2021-07-23 | Discharge: 2021-07-23 | Disposition: A | Discharge status: Home / Self Care | Payer: Commercial Managed Care - PPO | Attending: Obstetrics & Gynecology | Admitting: Obstetrics & Gynecology

## 2021-07-23 ENCOUNTER — Other Ambulatory Visit: Payer: Self-pay

## 2021-07-23 ENCOUNTER — Inpatient Hospital Stay (HOSPITAL_COMMUNITY)
Admission: AD | Admit: 2021-07-23 | Discharge: 2021-07-23 | Disposition: A | Discharge status: Home / Self Care | Payer: Commercial Managed Care - PPO | Attending: Obstetrics & Gynecology | Admitting: Obstetrics & Gynecology

## 2021-07-23 DIAGNOSIS — O039 Complete or unspecified spontaneous abortion without complication: Secondary | ICD-10-CM | POA: Diagnosis present

## 2021-07-23 DIAGNOSIS — Z3A01 Less than 8 weeks gestation of pregnancy: Secondary | ICD-10-CM | POA: Diagnosis not present

## 2021-07-23 LAB — HCG, QUANTITATIVE, PREGNANCY: hCG, Beta Chain, Quant, S: 13 m[IU]/mL — ABNORMAL HIGH (ref <5)

## 2021-07-23 NOTE — MAU Note (Signed)
Katherine Wilkerson is a 41 y.o. at [redacted]w[redacted]d here in MAU reporting: here for follow up hcg. Had some pain yesterday but none today. Bleeding was heavy yesterday with lots of clots but today is lighter. Is wearing a pad and has changed about 2 times.   Onset of complaint: ongoing  Pain score: 0/10  Vitals:   07/23/21 1019  BP: 115/65  Pulse: 83  Resp: 16  Temp: 97.7 F (36.5 C)  SpO2: 98%     Lab orders placed from triage: hcg

## 2021-07-23 NOTE — MAU Provider Note (Signed)
History   Chief Complaint:  Follow-up   Katherine Wilkerson is  41 y.o. [id number] Patient's last menstrual period was 06/11/2021.Marland Kitchen Patient is here for follow up of quantitative HCG and ongoing surveillance of pregnancy status. She is [redacted]w[redacted]d weeks gestation  by LMP.    Since her last visit, the patient is without new complaint. The patient reports bleeding as  none now.  She denies any pain.  General ROS:  negative  Her previous Quantitative HCG values are:  7/21: 42  Physical Exam   Blood pressure 115/65, pulse 83, temperature 97.7 F (36.5 C), temperature source Oral, resp. rate 16, height 5\' 1"  (1.549 m), weight 75 kg, last menstrual period 06/11/2021, SpO2 98 %, unknown if currently breastfeeding.   Physical Exam Vitals and nursing note reviewed.  Constitutional:      General: She is not in acute distress.    Appearance: She is well-developed.  HENT:     Head: Normocephalic.  Eyes:     Pupils: Pupils are equal, round, and reactive to light.  Cardiovascular:     Rate and Rhythm: Normal rate and regular rhythm.  Pulmonary:     Effort: Pulmonary effort is normal. No respiratory distress.     Breath sounds: Normal breath sounds.  Abdominal:     Palpations: Abdomen is soft.     Tenderness: There is no abdominal tenderness.  Musculoskeletal:        General: Normal range of motion.     Cervical back: Normal range of motion.  Skin:    General: Skin is warm and dry.  Neurological:     Mental Status: She is alert and oriented to person, place, and time.  Psychiatric:        Behavior: Behavior normal.        Thought Content: Thought content normal.        Judgment: Judgment normal.       Labs: Results for orders placed or performed during the hospital encounter of 07/23/21 (from the past 24 hour(s))  hCG, quantitative, pregnancy   Collection Time: 07/23/21 10:13 AM  Result Value Ref Range   hCG, Beta Chain, Quant, S 13 (H) <5 mIU/mL   Assessment:   1. Miscarriage    2. [redacted] weeks gestation of pregnancy     -Results reviewed and condolences provided. Patient already has appointment in office this week for follow up.   Plan: -Discharge home in stable condition -Miscarriage precautions discussed -Patient advised to follow-up with OB as scheduled for SAB follow up -Patient may return to MAU as needed or if her condition were to change or worsen  07/25/21, CNM 07/23/2021, 1:34 PM

## 2021-07-23 NOTE — Discharge Instructions (Signed)

## 2022-04-22 ENCOUNTER — Emergency Department (HOSPITAL_BASED_OUTPATIENT_CLINIC_OR_DEPARTMENT_OTHER)
Admission: EM | Admit: 2022-04-22 | Discharge: 2022-04-22 | Disposition: A | Discharge status: Home / Self Care | Payer: Commercial Managed Care - PPO | Attending: Emergency Medicine | Admitting: Emergency Medicine

## 2022-04-22 ENCOUNTER — Encounter (HOSPITAL_BASED_OUTPATIENT_CLINIC_OR_DEPARTMENT_OTHER): Payer: Self-pay

## 2022-04-22 ENCOUNTER — Emergency Department (HOSPITAL_BASED_OUTPATIENT_CLINIC_OR_DEPARTMENT_OTHER): Payer: Commercial Managed Care - PPO

## 2022-04-22 ENCOUNTER — Other Ambulatory Visit: Payer: Self-pay

## 2022-04-22 DIAGNOSIS — R109 Unspecified abdominal pain: Secondary | ICD-10-CM | POA: Diagnosis present

## 2022-04-22 DIAGNOSIS — K529 Noninfective gastroenteritis and colitis, unspecified: Secondary | ICD-10-CM | POA: Insufficient documentation

## 2022-04-22 LAB — URINALYSIS, ROUTINE W REFLEX MICROSCOPIC
Bilirubin Urine: NEGATIVE
Glucose, UA: NEGATIVE mg/dL
Hgb urine dipstick: NEGATIVE
Ketones, ur: NEGATIVE mg/dL
Nitrite: NEGATIVE
Specific Gravity, Urine: 1.02 (ref 1.005–1.030)
pH: 5.5 (ref 5.0–8.0)

## 2022-04-22 LAB — COMPREHENSIVE METABOLIC PANEL
ALT: 11 U/L (ref 0–44)
AST: 13 U/L — ABNORMAL LOW (ref 15–41)
Albumin: 4.3 g/dL (ref 3.5–5.0)
Alkaline Phosphatase: 51 U/L (ref 38–126)
Anion gap: 8 (ref 5–15)
BUN: 9 mg/dL (ref 6–20)
CO2: 24 mmol/L (ref 22–32)
Calcium: 8.9 mg/dL (ref 8.9–10.3)
Chloride: 107 mmol/L (ref 98–111)
Creatinine, Ser: 0.57 mg/dL (ref 0.44–1.00)
GFR, Estimated: >60 mL/min (ref >60)
Glucose, Bld: 103 mg/dL — ABNORMAL HIGH (ref 70–99)
Potassium: 3.9 mmol/L (ref 3.5–5.1)
Sodium: 139 mmol/L (ref 135–145)
Total Bilirubin: 0.7 mg/dL (ref 0.3–1.2)
Total Protein: 7.1 g/dL (ref 6.5–8.1)

## 2022-04-22 LAB — CBC
HCT: 35.9 % — ABNORMAL LOW (ref 36.0–46.0)
Hemoglobin: 11.3 g/dL — ABNORMAL LOW (ref 12.0–15.0)
MCH: 24.4 pg — ABNORMAL LOW (ref 26.0–34.0)
MCHC: 31.5 g/dL (ref 30.0–36.0)
MCV: 77.4 fL — ABNORMAL LOW (ref 80.0–100.0)
Platelets: 256 10*3/uL (ref 150–400)
RBC: 4.64 MIL/uL (ref 3.87–5.11)
RDW: 13.8 % (ref 11.5–15.5)
WBC: 6.9 10*3/uL (ref 4.0–10.5)
nRBC: 0 % (ref 0.0–0.2)

## 2022-04-22 LAB — PREGNANCY, URINE: Preg Test, Ur: NEGATIVE

## 2022-04-22 LAB — LIPASE, BLOOD: Lipase: 30 U/L (ref 11–51)

## 2022-04-22 MED ORDER — MORPHINE SULFATE (PF) 4 MG/ML IV SOLN
4.0000 mg | Freq: Once | INTRAVENOUS | Status: DC
  Filled 2022-04-22: qty 1

## 2022-04-22 MED ORDER — ONDANSETRON HCL 4 MG PO TABS: 4.0000 mg | ORAL_TABLET | Freq: Four times a day (QID) | ORAL | 0 refills | Status: AC

## 2022-04-22 MED ORDER — ONDANSETRON HCL 4 MG/2ML IJ SOLN
4.0000 mg | Freq: Once | INTRAMUSCULAR | Status: AC
  Administered 2022-04-22: 4 mg via INTRAVENOUS
  Filled 2022-04-22: qty 2

## 2022-04-22 MED ORDER — IOHEXOL 300 MG/ML  SOLN
100.0000 mL | Freq: Once | INTRAMUSCULAR | Status: AC | PRN
  Administered 2022-04-22: 80 mL via INTRAVENOUS

## 2022-04-22 MED ORDER — ACETAMINOPHEN 325 MG PO TABS
650.0000 mg | ORAL_TABLET | Freq: Once | ORAL | Status: AC
  Administered 2022-04-22: 650 mg via ORAL
  Filled 2022-04-22: qty 2

## 2022-04-22 MED ORDER — DICYCLOMINE HCL 20 MG PO TABS: 20.0000 mg | ORAL_TABLET | Freq: Two times a day (BID) | ORAL | 0 refills | Status: AC

## 2022-04-22 NOTE — ED Provider Notes (Signed)
[place] EMERGENCY DEPARTMENT AT [facility] Provider Note   CSN: [id number] Arrival date & time: 04/22/22  1100     History  Chief Complaint  Patient presents with   Abdominal Pain   Back Pain    Katherine Wilkerson is a 42 y.o. female.  Patient is a 42 year old female with no significant past medical history presenting to the emergency department with abdominal pain.  The patient states that she started to develop a periumbilical cramping pain yesterday that she initially thought was her period starting.  She states that the pain worsened throughout the night which prompted her to come to the emergency department.  She states that she has had nausea but denies any vomiting.  States she has had diarrhea but denies any black or bloody stools.  She denies any dysuria or hematuria, abnormal vaginal bleeding or discharge.  The history is provided by the patient.  Abdominal Pain Back Pain Associated symptoms: abdominal pain        Home Medications Prior to Admission medications   Medication Sig Start Date End Date Taking? Authorizing Provider  dicyclomine (BENTYL) 20 MG tablet Take 1 tablet (20 mg total) by mouth 2 (two) times daily. 04/22/22  Yes Theresia Lo, [place] K, DO  ondansetron (ZOFRAN) 4 MG tablet Take 1 tablet (4 mg total) by mouth every 6 (six) hours. 04/22/22  Yes Theresia Lo, Benetta Spar K, DO  docusate sodium (COLACE) 100 MG capsule Take 1 capsule (100 mg total) by mouth 2 (two) times daily. 05/01/19   Ranae Pila, MD  Elastic Bandages & Supports (COMFORT FIT MATERNITY SUPP SM) MISC 1 Units by Does not apply route daily as needed. 03/09/19   Nugent, Odie Sera, NP  ferrous sulfate 325 (65 FE) MG EC tablet Take 1 tablet (325 mg total) by mouth 2 (two) times daily. 05/01/19   Ranae Pila, MD  fluticasone (FLONASE) 50 MCG/ACT nasal spray PLACE 1 SPRAY INTO BOTH NOSTRILS 2 (TWO) TIMES DAILY. 03/05/18   Deeann Saint, MD  Prenatal Vit-Fe Fumarate-FA  (PRENATAL MULTIVITAMIN) TABS tablet Take 1 tablet by mouth daily at 12 noon.    [provider]  Probiotic Product (PROBIOTIC DAILY PO) Take by mouth.    [provider]  sertraline (ZOLOFT) 100 MG tablet  02/10/18   [provider]      Allergies    Amoxil [amoxicillin], Doxycycline, Keflex [cephalexin], Septra [sulfamethoxazole-trimethoprim], and Sulfa antibiotics    Review of Systems   Review of Systems  Gastrointestinal:  Positive for abdominal pain.  Musculoskeletal:  Positive for back pain.    Physical Exam Updated Vital Signs BP 112/60   Pulse 83   Temp 97.9 F (36.6 C) (Oral)   Resp 18   Ht 5\' 1"  (1.549 m)   Wt 74.8 kg   LMP 03/31/2022   SpO2 99%   Breastfeeding Unknown   BMI 31.18 kg/m  Physical Exam Vitals and nursing note reviewed.  Constitutional:      General: She is not in acute distress.    Appearance: She is well-developed.  HENT:     Head: Normocephalic and atraumatic.     Mouth/Throat:     Mouth: Mucous membranes are moist.     Pharynx: Oropharynx is clear.  Eyes:     Extraocular Movements: Extraocular movements intact.  Cardiovascular:     Rate and Rhythm: Normal rate and regular rhythm.     Heart sounds: Normal heart sounds.  Pulmonary:     Effort: Pulmonary  effort is normal.     Breath sounds: Normal breath sounds.  Abdominal:     General: Abdomen is flat.     Palpations: Abdomen is soft.     Tenderness: There is abdominal tenderness in the periumbilical area. There is no guarding or rebound.  Skin:    General: Skin is warm and dry.  Neurological:     General: No focal deficit present.     Mental Status: She is alert and oriented to person, place, and time.  Psychiatric:        Mood and Affect: Mood normal.        Behavior: Behavior normal.     ED Results / Procedures / Treatments   Labs (all labs ordered are listed, but only abnormal results are displayed) Labs Reviewed  COMPREHENSIVE METABOLIC PANEL -  Abnormal; Notable for the following components:      Result Value   Glucose, Bld 103 (*)    AST 13 (*)    All other components within normal limits  CBC - Abnormal; Notable for the following components:   Hemoglobin 11.3 (*)    HCT 35.9 (*)    MCV 77.4 (*)    MCH 24.4 (*)    All other components within normal limits  URINALYSIS, ROUTINE W REFLEX MICROSCOPIC - Abnormal; Notable for the following components:   Protein, ur TRACE (*)    Leukocytes,Ua SMALL (*)    Bacteria, UA FEW (*)    All other components within normal limits  LIPASE, BLOOD  PREGNANCY, URINE    EKG None  Radiology CT ABDOMEN PELVIS W CONTRAST  Result Date: 04/22/2022 CLINICAL DATA:  Pain.  Central abdominal cramping EXAM: CT ABDOMEN AND PELVIS WITH CONTRAST TECHNIQUE: Multidetector CT imaging of the abdomen and pelvis was performed using the standard protocol following bolus administration of intravenous contrast. RADIATION DOSE REDUCTION: This exam was performed according to the departmental dose-optimization program which includes automated exposure control, adjustment of the mA and/or kV according to patient size and/or use of iterative reconstruction technique. CONTRAST:  80mL OMNIPAQUE IOHEXOL 300 MG/ML  SOLN COMPARISON:  None Available. FINDINGS: Lower chest: Lung bases are clear. No pleural effusion. Trace pericardial fluid. Hepatobiliary: No focal liver abnormality is seen. No gallstones, gallbladder wall thickening, or biliary dilatation. Pancreas: Unremarkable. No pancreatic ductal dilatation or surrounding inflammatory changes. Spleen: Normal in size without focal abnormality. Adrenals/Urinary Tract: Adrenal glands are unremarkable. Kidneys are normal, without renal calculi, focal lesion, or hydronephrosis. Bladder is unremarkable. Stomach/Bowel: Large bowel has a normal course and caliber with scattered stool. The appendix is not clearly seen in the right lower quadrant but no pericecal stranding or fluid. Stomach  is nondilated. Proximal third portion duodenal diverticula identified. Small bowel is nondilated. There is a loop appears to be ileum in the central abdominal mesentery with specific wall thickening and slight stranding with some vascular engorgement. An area of enteritis. No obstruction, free air or free fluid. Vascular/Lymphatic: No significant vascular findings are present. No enlarged abdominal or pelvic lymph nodes. Reproductive: Slightly retroflexed uterus.  No adnexal mass. Other: Small amount of free fluid in the pelvis which could be physiologic. No free air. Small fat containing umbilical hernia. Musculoskeletal: Sclerotic bone lesion involving the left iliac bone on image 45 of series 2. This has a narrow zone transition has been features. Please correlate with any prior. IMPRESSION: Short-segment small bowel wall thickening with stranding and vascular engorgement without obstruction, free air. Focal enteritis is possible. Infectious or inflammatory.  Please correlate with symptomatology recommend follow-up. Trace free fluid in the pelvis could be physiologic. Sclerotic lesion involving the left iliac bone. This has benign features. Please correlate for any known history. If there is concern of an aggressive process and any history of malignancy, bone scan could be performed if there is no prior Electronically Signed   By: Karen Kays M.D.   On: 04/22/2022 13:08    Procedures Procedures    Medications Ordered in ED Medications  morphine (PF) 4 MG/ML injection 4 mg (4 mg Intravenous Patient Refused/Not Given 04/22/22 1203)  ondansetron (ZOFRAN) injection 4 mg (4 mg Intravenous Given 04/22/22 1158)  acetaminophen (TYLENOL) tablet 650 mg (650 mg Oral Given 04/22/22 1307)  iohexol (OMNIPAQUE) 300 MG/ML solution 100 mL (80 mLs Intravenous Contrast Given 04/22/22 1244)    ED Course/ Medical Decision Making/ A&P Clinical Course as of 04/22/22 1405  Sun Apr 22, 2022  1216 Work up with few  leuks/bacteria in UA making UTI less likely. No obvious etiology for pain. Will proceed with CTAP. [VK]  1315 CT findings concerning for enteritis which is consistent with her symptoms. CT also with possible sclerotic lesion, no history of malignancy. She will be discharged home with symptomatic management and primary care follow up. [VK]    Clinical Course User Index [VK] Rexford Maus, DO                             Medical Decision Making This patient presents to the ED with chief complaint(s) of abdominal pain with no pertinent past medical history which further complicates the presenting complaint. The complaint involves an extensive differential diagnosis and also carries with it a high risk of complications and morbidity.    The differential diagnosis includes gastroenteritis, appendicitis, UTI, pregnancy, ectopic, STI less likely as no pelvic pain or abnormal discharge, dehydration, electrolyte abnormality  Additional history obtained: Additional history obtained from family Records reviewed N/A  ED Course and Reassessment: On patient's arrival she is hemodynamically stable in no acute distress.  She will have labs including urine and pregnancy test performed to evaluate for cause of her pain.  If she is not pregnant and no obvious UTI will likely need CT imaging to further evaluate for cause of her pain.  She was given Tylenol and Zofran for symptomatic management and will be closely reassessed.  Independent labs interpretation:  The following labs were independently interpreted: Within normal range  Independent visualization of imaging: - I independently visualized the following imaging with scope of interpretation limited to determining acute life threatening conditions related to emergency care: CT AP, which revealed enteritis  Consultation: - Consulted or discussed management/test interpretation w/ external professional: N/A  Consideration for admission or further  workup: Patient has no emergent conditions requiring admission or further work-up at this time and is stable for discharge home with primary care follow-up  Social Determinants of health: N/A    Amount and/or Complexity of Data Reviewed Labs: ordered. Radiology: ordered.  Risk OTC drugs. Prescription drug management.          Final Clinical Impression(s) / ED Diagnoses Final diagnoses:  Enteritis    Rx / DC Orders ED Discharge Orders          Ordered    dicyclomine (BENTYL) 20 MG tablet  2 times daily        04/22/22 1404    ondansetron (ZOFRAN) 4 MG tablet  Every 6 hours  04/22/22 1404              Elayne Snare K, DO 04/22/22 1405

## 2022-04-22 NOTE — Discharge Instructions (Signed)
You were seen in the emergency department for your abdominal pain.  You have enteritis which is an inflammation of your small bowel and is commonly due to viral infections.  You can take Zofran as needed for nausea and Tylenol or Bentyl as needed for abdominal pain or stomach cramping.  You can take over-the-counter Imodium if needed for diarrhea.  You should make sure that you are drinking plenty of fluids and staying well-hydrated.  You should follow-up with your primary doctor in the next few days to have your symptoms rechecked.  You should return to the emergency department for significantly worsening pain, repetitive vomiting despite the nausea medication or if you have any other new or concerning symptoms.

## 2022-04-22 NOTE — ED Triage Notes (Signed)
Patient arrives ambulatory to ED with complaints of central abdominal pain/cramping, going into her back. Patient also reports chills yesterday as well.   Rates pain 7/10

## 2022-04-22 NOTE — ED Notes (Signed)
Patient given discharge instructions. Questions were answered. Patient verbalized understanding of discharge instructions and care at home.  

## 2022-10-02 ENCOUNTER — Other Ambulatory Visit: Payer: Self-pay | Admitting: Emergency Medicine

## 2022-10-02 DIAGNOSIS — Z1231 Encounter for screening mammogram for malignant neoplasm of breast: Secondary | ICD-10-CM

## 2022-10-06 ENCOUNTER — Ambulatory Visit
Admission: RE | Admit: 2022-10-06 | Discharge: 2022-10-06 | Disposition: A | Discharge status: Home / Self Care | Payer: Commercial Managed Care - PPO | Source: Ambulatory Visit | Attending: Emergency Medicine | Admitting: Emergency Medicine

## 2022-10-06 DIAGNOSIS — Z1231 Encounter for screening mammogram for malignant neoplasm of breast: Secondary | ICD-10-CM

## 2023-08-12 ENCOUNTER — Other Ambulatory Visit: Payer: Self-pay | Admitting: Obstetrics and Gynecology

## 2023-08-12 DIAGNOSIS — Z1231 Encounter for screening mammogram for malignant neoplasm of breast: Secondary | ICD-10-CM

## 2023-10-07 ENCOUNTER — Ambulatory Visit
Admission: RE | Admit: 2023-10-07 | Discharge: 2023-10-07 | Disposition: A | Discharge status: Home / Self Care | Source: Ambulatory Visit | Attending: Obstetrics and Gynecology | Admitting: Obstetrics and Gynecology

## 2023-10-07 DIAGNOSIS — Z1231 Encounter for screening mammogram for malignant neoplasm of breast: Secondary | ICD-10-CM
